# Patient Record
Sex: Male | Born: 1945
Health system: Southern US, Community
[De-identification: ages and names within clinical notes are randomized; demographics above are authoritative.]

## PROBLEM LIST (undated history)

## (undated) DIAGNOSIS — K635 Polyp of colon: Secondary | ICD-10-CM

## (undated) DIAGNOSIS — M1990 Unspecified osteoarthritis, unspecified site: Secondary | ICD-10-CM

## (undated) DIAGNOSIS — J351 Hypertrophy of tonsils: Secondary | ICD-10-CM

## (undated) DIAGNOSIS — G4730 Sleep apnea, unspecified: Secondary | ICD-10-CM

## (undated) DIAGNOSIS — R0602 Shortness of breath: Secondary | ICD-10-CM

## (undated) HISTORY — PX: EYE SURGERY: SHX253

## (undated) HISTORY — PX: FEMUR SOFT TISSUE TUMOR EXCISION: SUR552

---

## 1992-04-29 HISTORY — PX: REFRACTIVE SURGERY: SHX103

## 2008-04-29 HISTORY — PX: APPENDECTOMY: SHX54

## 2008-08-12 ENCOUNTER — Ambulatory Visit (HOSPITAL_COMMUNITY): Admission: EM | Admit: 2008-08-12 | Discharge: 2008-08-13 | Payer: Self-pay | Admitting: Emergency Medicine

## 2008-08-12 ENCOUNTER — Encounter (INDEPENDENT_AMBULATORY_CARE_PROVIDER_SITE_OTHER): Payer: Self-pay | Admitting: Surgery

## 2010-08-08 LAB — URINALYSIS, ROUTINE W REFLEX MICROSCOPIC
Hgb urine dipstick: NEGATIVE
Ketones, ur: 40 mg/dL — AB
Protein, ur: NEGATIVE mg/dL
Specific Gravity, Urine: 1.019 (ref 1.005–1.030)
Urobilinogen, UA: 0.2 mg/dL (ref 0.0–1.0)

## 2010-08-08 LAB — COMPREHENSIVE METABOLIC PANEL
AST: 22 U/L (ref 0–37)
CO2: 25 mEq/L (ref 19–32)
Calcium: 9.7 mg/dL (ref 8.4–10.5)
Creatinine, Ser: 1.03 mg/dL (ref 0.4–1.5)
GFR calc Af Amer: >60 mL/min (ref >60)
GFR calc non Af Amer: >60 mL/min (ref >60)
Glucose, Bld: 121 mg/dL — ABNORMAL HIGH (ref 70–99)
Total Protein: 7.6 g/dL (ref 6.0–8.3)

## 2010-08-08 LAB — CBC
MCHC: 34.1 g/dL (ref 30.0–36.0)
MCV: 92.6 fL (ref 78.0–100.0)
RBC: 5.05 MIL/uL (ref 4.22–5.81)
RDW: 13.7 % (ref 11.5–15.5)

## 2010-08-08 LAB — DIFFERENTIAL
Lymphocytes Relative: 9 % — ABNORMAL LOW (ref 12–46)
Lymphs Abs: 1 10*3/uL (ref 0.7–4.0)
Neutrophils Relative %: 87 % — ABNORMAL HIGH (ref 43–77)

## 2010-08-08 LAB — LIPASE, BLOOD: Lipase: 22 U/L (ref 11–59)

## 2010-09-11 NOTE — H&P (Signed)
NAME:  Kyle Beck, BEAUBRUN NO.:  [id number]   MEDICAL RECORD NO.:  [id number]          PATIENT TYPE:  INP   LOCATION:  1534                         FACILITY:  [facility]   PHYSICIAN:  Clovis Pu. Cornett, M.D.DATE OF BIRTH:  17-Jan-1946   DATE OF ADMISSION:  08/12/2008  DATE OF DISCHARGE:                              HISTORY & PHYSICAL   CHIEF COMPLAINT:  Right lower quadrant pain.   HISTORY OF PRESENT ILLNESS:  The patient is a 65 year old male with a 1-  day history of right lower quadrant pain.  The pain is located in his  right lower quadrant.  It is severe in nature, 10/10, with no radiation.  It is associated with increased pain with movement and activity and  better when he lies still.  There is no diarrhea, nausea, vomiting, or  constipation.   PAST MEDICAL HISTORY:  None.   PAST SURGICAL HISTORY:  None.   FAMILY HISTORY:  None.   SOCIAL HISTORY:  Positive tobacco use.  Occasional alcohol use.  He is  married.   ALLERGIES:  CODEINE.   MEDICATIONS:  None.   REVIEW OF SYSTEMS:  Positive for the above-mentioned problems, otherwise  negative x15.   PHYSICAL EXAMINATION:  VITAL SIGNS:  Temperature 97, pulse 82, and blood  pressure 163/78.  HEENT:  Extraocular movements are intact.  Oropharynx is clear.  NECK:  Supple and nontender.  Trachea midline.  PULMONARY:  Lungs are clear auscultation bilaterally.  Chest wall motion  normal.  CARDIOVASCULAR:  Regular rate and rhythm without rub, murmur, or gallop.  EXTREMITIES:  Warm and well perfused.  ABDOMEN:  Tender in right lower quadrant with rebound and guarding  noted.  No hernia.  No ascites.  EXTREMITIES:  No joint swelling.  Full range of motion of extremities.  Muscle tone normal.  NEURO:  Glasgow coma scale is 15.  Motor and sensory function are  intact.   DIAGNOSTIC STUDIES:  Abdominopelvic CT shows acute appendicitis without  perforation.  Sodium 132, potassium 4.2, chloride 97, CO2 25, BUN 11,  creatinine 1, glucose 121, white count 12,000 with left shift, and  hemoglobin 16.  Urinalysis unremarkable.   IMPRESSION:  Acute appendicitis.   PLAN:  Laparoscopic appendectomy in the operating room.  Risk of  bleeding, infection, and perforation of other organs were discussed with  potential complications.  Also the potential need for open conversion  was discussed with the patient.  He understands the above and agrees to  proceed.      [name], M.D.  Electronically Signed     TAC/MEDQ  D:  08/12/2008  T:  08/13/2008  Job:  [id number]

## 2010-09-11 NOTE — Discharge Summary (Signed)
NAME:  Kyle Beck, Kyle Beck NO.:  [id number]   MEDICAL RECORD NO.:  [id number]          PATIENT TYPE:  INP   LOCATION:  1534                         FACILITY:  [facility]   PHYSICIAN:  Clovis Pu. Cornett, M.D.DATE OF BIRTH:  April 03, 1946   DATE OF ADMISSION:  08/12/2008  DATE OF DISCHARGE:  08/13/2008                               DISCHARGE SUMMARY   ADMISSION DIAGNOSIS:  Acute appendicitis.   DISCHARGE DIAGNOSIS:  Acute appendicitis.   PROCEDURE PERFORMED:  Laparoscopic appendectomy.   HISTORY:  The patient is a 65 year old male with acute appendicitis.  He  was admitted on 08/12/2008, for a laparoscopic appendectomy.   HOSPITAL COURSE:  The patient was admitted on 08/12/2008.  He was  discharged home on 08/13/2008.  He had adequate pain control.  His  abdomen was soft and nontender.  His diet was advanced.  He had no  complaints.  His wounds were clean, dry and intact.   DISCHARGE INSTRUCTIONS:  1. He will follow up in three weeks.  2. He will refrain from heavy activity for 10 to 14 days.  3. He was given a prescription for Vicodin, one to two tab q.4h.      p.r.n. pain.  4. He is to resume a regular diet.  5. He may shower today.      [name], M.D.  Electronically Signed     TAC/MEDQ  D:  08/13/2008  T:  08/13/2008  Job:  [id number]

## 2010-09-11 NOTE — Op Note (Signed)
NAME:  MASUD, HOLUB NO.:  [id number]   MEDICAL RECORD NO.:  [id number]          PATIENT TYPE:  INP   LOCATION:  0098                         FACILITY:  [facility]   PHYSICIAN:  Clovis Pu. Cornett, M.D.DATE OF BIRTH:  08/30/1945   DATE OF PROCEDURE:  08/12/2008  DATE OF DISCHARGE:                               OPERATIVE REPORT   PREOPERATIVE DIAGNOSIS:  Acute appendicitis.   POSTOPERATIVE DIAGNOSIS:  Acute appendicitis.   PROCEDURE:  Laparoscopic appendectomy.   SURGEON:  Dr. Harriette Bouillon.   ANESTHESIA:  General endotracheal anesthesia with 0.25% Sensorcaine  local.   ESTIMATED BLOOD LOSS:  30 mL.   SPECIMEN:  Appendix to pathology.   DRAINS:  None.   INDICATIONS FOR PROCEDURE:  The patient is a 65 year old male with a 1-  day history of right lower quadrant pain.  CT scan showed acute  appendicitis and he is brought to the operating room for laparoscopic  appendectomy.   DESCRIPTION OF PROCEDURE:  The patient was brought to the operating room  and placed supine.  After induction of general anesthesia, both arms  were tucked and a Foley catheter was placed.  The abdomen was then  prepped and draped in a sterile fashion.  A 1-cm supraumbilical incision  was made.  Dissection was carried down to the fascia and the fascia was  opened with the scalpel.  The abdominal cavity was entered easily under  direct vision.  A pursestring suture of 0 Vicryl was placed a 12-mm  Hassan cannula was placed under direct vision.  Pneumoperitoneum was  created to 15 mmHg of CO2 and the laparoscope was placed.  Upon  inspection, the cecum was identified.  Two 5-mm ports were placed, one  in the midline between the umbilicus and the pubis and the other in the  left lower quadrant.  The appendix was easily identified, grabbed by its  tip and pulled up.  The harmonic scalpel was used to take the  mesoappendix down to the base of the appendix at the junction of the  cecum.  We  then used a 5-mm scope and a GIA 45 stapling device was  placed across the base of the appendix of the cecum and fired.  The  appendix was placed in an EndoCatch bag and passed off the field.  We  replaced the port and replaced the 10-mm camera.  We irrigated out the  abdominal cavity.  There was some oozing from the staple line and  Surgicel was placed, and this was a satisfactory result.  The  mesoappendix was hemostatic.  I did not see any significant intra-  abdominal at this point.  The small bowel and colon were grossly normal.  The liver and gallbladder appeared grossly normal.  At this point in  time we let the CO2 escape and removed the ports under direct vision.  The port site  was closed with a pursestring suture of 0 Vicryl and 4-0 Monocryl was  used to close the skin incisions.  All final counts of sponge, needle  and instruments were found to be correct at this portion  of the case.  The patient was awakened and taken to recovery in satisfactory  condition.      [name], M.D.  Electronically Signed     TAC/MEDQ  D:  08/12/2008  T:  08/13/2008  Job:  [id number]

## 2011-04-30 HISTORY — PX: COLONOSCOPY: SHX174

## 2011-05-03 DIAGNOSIS — J0100 Acute maxillary sinusitis, unspecified: Secondary | ICD-10-CM | POA: Diagnosis not present

## 2011-05-07 ENCOUNTER — Other Ambulatory Visit: Payer: Self-pay | Admitting: Dermatology

## 2011-05-07 DIAGNOSIS — L259 Unspecified contact dermatitis, unspecified cause: Secondary | ICD-10-CM | POA: Diagnosis not present

## 2011-05-07 DIAGNOSIS — D2370 Other benign neoplasm of skin of unspecified lower limb, including hip: Secondary | ICD-10-CM | POA: Diagnosis not present

## 2011-05-07 DIAGNOSIS — D485 Neoplasm of uncertain behavior of skin: Secondary | ICD-10-CM | POA: Diagnosis not present

## 2011-05-07 DIAGNOSIS — L219 Seborrheic dermatitis, unspecified: Secondary | ICD-10-CM | POA: Diagnosis not present

## 2011-05-13 DIAGNOSIS — E785 Hyperlipidemia, unspecified: Secondary | ICD-10-CM | POA: Diagnosis not present

## 2011-05-13 DIAGNOSIS — M109 Gout, unspecified: Secondary | ICD-10-CM | POA: Diagnosis not present

## 2011-05-13 DIAGNOSIS — J381 Polyp of vocal cord and larynx: Secondary | ICD-10-CM | POA: Diagnosis not present

## 2011-05-13 DIAGNOSIS — J3089 Other allergic rhinitis: Secondary | ICD-10-CM | POA: Diagnosis not present

## 2011-05-14 ENCOUNTER — Ambulatory Visit (HOSPITAL_BASED_OUTPATIENT_CLINIC_OR_DEPARTMENT_OTHER): Payer: Medicare Other | Attending: Otolaryngology | Admitting: Radiology

## 2011-05-14 VITALS — Ht 70.0 in | Wt 235.0 lb

## 2011-05-14 DIAGNOSIS — I4949 Other premature depolarization: Secondary | ICD-10-CM | POA: Diagnosis not present

## 2011-05-14 DIAGNOSIS — G4733 Obstructive sleep apnea (adult) (pediatric): Secondary | ICD-10-CM | POA: Diagnosis not present

## 2011-05-14 DIAGNOSIS — I491 Atrial premature depolarization: Secondary | ICD-10-CM | POA: Insufficient documentation

## 2011-05-14 DIAGNOSIS — G4730 Sleep apnea, unspecified: Secondary | ICD-10-CM

## 2011-05-18 DIAGNOSIS — I491 Atrial premature depolarization: Secondary | ICD-10-CM | POA: Diagnosis not present

## 2011-05-18 DIAGNOSIS — G4733 Obstructive sleep apnea (adult) (pediatric): Secondary | ICD-10-CM

## 2011-05-18 DIAGNOSIS — I4949 Other premature depolarization: Secondary | ICD-10-CM

## 2011-05-18 NOTE — Procedures (Signed)
NAME:  Kyle Beck, Kyle Beck NO.:  [id number]  MEDICAL RECORD NO.:  [id number]          PATIENT TYPE:  OUT  LOCATION:  SLEEP CENTER                 FACILITY:  [facility]  PHYSICIAN:  Clinton D. Maple Hudson, MD, FCCP, FACPDATE OF BIRTH:  12-15-1945  DATE OF STUDY:  05/14/2011                           NOCTURNAL POLYSOMNOGRAM  REFERRING PHYSICIAN:  Hermelinda Medicus, M.D.  INDICATION FOR STUDY:  Hypersomnia with sleep apnea.  EPWORTH SLEEPINESS SCORE:  3/24.  BMI 33.7, weight 235 pounds, height 70 inches, neck 17 inches.  MEDICATIONS:  Charted and reviewed.  SLEEP ARCHITECTURE:  Split study protocol.  During the diagnostic phase, total sleep time 122.5 minutes with sleep efficiency 71%.  Stage I was 12.2%, stage II 77.6%, stage III absent, REM 10.2% of total sleep time. Sleep latency 15.5 minutes, REM latency 39 minutes.  Awake after sleep onset 25 minutes.  Arousal index 35.8.  Bedtime medication: None.  RESPIRATORY DATA:  Split study protocol.  Apnea hypopnea index (AHI) 24 per hour.  A total of 49 events was scored including 25 obstructive apneas and 24 hypopneas.  Events were seen in all sleep positions.  REM AHI 48 per hour.  CPAP was then titrated to 10 CWP, AHI 0.9 per hour. He wore a medium ResMed Mirage Quattro full face mask with heated humidifier and a C flex setting of 2.  OXYGEN DATA:  Before CPAP snoring was moderately loud with oxygen desaturation to a nadir of 79% on room air.  After CPAP titration, snoring was prevented and mean oxygen saturation held 92.7% on room air.  CARDIAC DATA:  Sinus rhythm with occasional PVC and PAC.  MOVEMENT-PARASOMNIA:  No significant movement disturbance.  Bathroom x1.  IMPRESSIONS-RECOMMENDATIONS: 1. Moderate obstructive sleep apnea/hypopnea syndrome, AHI 24 per hour     with moderately loud snoring and oxygen desaturation to a nadir of     79% on room air. 2. Successful CPAP titration to 10 CWP, AHI 0.9 per hour.  He wore a     medium ResMed Mirage Quattro full face mask with heated humidifier     and a C flex setting of 2.     Clinton D. Maple Hudson, MD, [facility], FACP Diplomate, Biomedical engineer of Sleep Medicine Electronically Signed    CDY/MEDQ  D:  05/18/2011 10:37:44  T:  05/18/2011 10:57:41  Job:  [id number]

## 2011-05-28 DIAGNOSIS — J320 Chronic maxillary sinusitis: Secondary | ICD-10-CM | POA: Diagnosis not present

## 2011-05-29 DIAGNOSIS — K648 Other hemorrhoids: Secondary | ICD-10-CM | POA: Diagnosis not present

## 2011-05-29 DIAGNOSIS — L290 Pruritus ani: Secondary | ICD-10-CM | POA: Diagnosis not present

## 2011-05-29 DIAGNOSIS — R21 Rash and other nonspecific skin eruption: Secondary | ICD-10-CM | POA: Diagnosis not present

## 2011-05-30 ENCOUNTER — Encounter (HOSPITAL_COMMUNITY): Payer: Self-pay | Admitting: Pharmacy Technician

## 2011-06-04 ENCOUNTER — Other Ambulatory Visit: Payer: Self-pay | Admitting: Dermatology

## 2011-06-04 DIAGNOSIS — D1801 Hemangioma of skin and subcutaneous tissue: Secondary | ICD-10-CM | POA: Diagnosis not present

## 2011-06-04 DIAGNOSIS — D485 Neoplasm of uncertain behavior of skin: Secondary | ICD-10-CM | POA: Diagnosis not present

## 2011-06-05 ENCOUNTER — Other Ambulatory Visit (HOSPITAL_COMMUNITY): Payer: Self-pay | Admitting: *Deleted

## 2011-06-06 ENCOUNTER — Encounter (HOSPITAL_COMMUNITY)
Admission: RE | Admit: 2011-06-06 | Discharge: 2011-06-06 | Disposition: A | Discharge status: Home / Self Care | Payer: Medicare Other | Source: Ambulatory Visit | Attending: Otolaryngology | Admitting: Otolaryngology

## 2011-06-06 ENCOUNTER — Other Ambulatory Visit: Payer: Self-pay

## 2011-06-06 ENCOUNTER — Encounter (HOSPITAL_COMMUNITY): Payer: Self-pay

## 2011-06-06 DIAGNOSIS — Z01810 Encounter for preprocedural cardiovascular examination: Secondary | ICD-10-CM | POA: Diagnosis not present

## 2011-06-06 DIAGNOSIS — D104 Benign neoplasm of tonsil: Secondary | ICD-10-CM | POA: Diagnosis not present

## 2011-06-06 DIAGNOSIS — J342 Deviated nasal septum: Secondary | ICD-10-CM | POA: Diagnosis not present

## 2011-06-06 DIAGNOSIS — G4733 Obstructive sleep apnea (adult) (pediatric): Secondary | ICD-10-CM | POA: Diagnosis not present

## 2011-06-06 DIAGNOSIS — Z01818 Encounter for other preprocedural examination: Secondary | ICD-10-CM | POA: Diagnosis not present

## 2011-06-06 DIAGNOSIS — Z01812 Encounter for preprocedural laboratory examination: Secondary | ICD-10-CM | POA: Diagnosis not present

## 2011-06-06 DIAGNOSIS — M47814 Spondylosis without myelopathy or radiculopathy, thoracic region: Secondary | ICD-10-CM | POA: Diagnosis not present

## 2011-06-06 DIAGNOSIS — J343 Hypertrophy of nasal turbinates: Secondary | ICD-10-CM | POA: Diagnosis not present

## 2011-06-06 DIAGNOSIS — Z01811 Encounter for preprocedural respiratory examination: Secondary | ICD-10-CM | POA: Diagnosis not present

## 2011-06-06 HISTORY — DX: Sleep apnea, unspecified: G47.30

## 2011-06-06 HISTORY — DX: Polyp of colon: K63.5

## 2011-06-06 HISTORY — DX: Unspecified osteoarthritis, unspecified site: M19.90

## 2011-06-06 HISTORY — DX: Shortness of breath: R06.02

## 2011-06-06 HISTORY — DX: Hypertrophy of tonsils: J35.1

## 2011-06-06 LAB — CBC
HCT: 47.6 % (ref 39.0–52.0)
Hemoglobin: 15.7 g/dL (ref 13.0–17.0)
MCH: 30.6 pg (ref 26.0–34.0)
MCHC: 33 g/dL (ref 30.0–36.0)
MCV: 92.8 fL (ref 78.0–100.0)
Platelets: 205 K/uL (ref 150–400)
RBC: 5.13 MIL/uL (ref 4.22–5.81)
RDW: 12.9 % (ref 11.5–15.5)
WBC: 7.5 K/uL (ref 4.0–10.5)

## 2011-06-06 LAB — DIFFERENTIAL
Basophils Absolute: 0 K/uL (ref 0.0–0.1)
Basophils Relative: 0 % (ref 0–1)
Eosinophils Absolute: 0.4 10*3/uL (ref 0.0–0.7)
Eosinophils Relative: 5 % (ref 0–5)
Lymphocytes Relative: 33 % (ref 12–46)
Lymphs Abs: 2.5 K/uL (ref 0.7–4.0)
Monocytes Absolute: 0.7 10*3/uL (ref 0.1–1.0)
Monocytes Relative: 9 % (ref 3–12)
Neutro Abs: 4 K/uL (ref 1.7–7.7)
Neutrophils Relative %: 53 % (ref 43–77)

## 2011-06-06 LAB — SURGICAL PCR SCREEN
MRSA, PCR: NEGATIVE
Staphylococcus aureus: NEGATIVE

## 2011-06-06 NOTE — Pre-Procedure Instructions (Signed)
20 Kyle Beck  06/06/2011   Your procedure is scheduled on:  Monday, Feb 11  Report to [facility] at *0740** AM.  Call this number if you have problems the morning of surgery: [phone]   Remember:   Do not eat food:After Midnight.  May have clear liquids: up to 4 Hours before arrival.  Clear liquids include soda, tea, black coffee, apple or grape juice, broth.  Take these medicines the morning of surgery with A SIP OF WATER: *none**   Do not wear jewelry, make-up or nail polish.  Do not wear lotions, powders, or perfumes. You may wear deodorant.  Do not shave 48 hours prior to surgery.  Do not bring valuables to the hospital.  Contacts, dentures or bridgework may not be worn into surgery.  Leave suitcase in the car. After surgery it may be brought to your room.  For patients admitted to the hospital, checkout time is 11:00 AM the day of discharge.   Patients discharged the day of surgery will not be allowed to drive home.  Name and phone number of your driver: *[name]**  Special Instructions: CHG Shower Use Special Wash: 1/2 bottle night before surgery and 1/2 bottle morning of surgery.   Please read over the following fact sheets that you were given: Pain Booklet, Coughing and Deep Breathing, MRSA Information and Surgical Site Infection Prevention

## 2011-06-07 NOTE — H&P (Signed)
NAME:  Kyle Beck, Kyle Beck NO.:  [id number]  MEDICAL RECORD NO.:  [id number]  LOCATION:  SDS                          FACILITY:  [facility]  PHYSICIAN:  Hermelinda Medicus, M.D.   DATE OF BIRTH:  1945/08/16  DATE OF ADMISSION:  06/06/2011 DATE OF DISCHARGE:  06/06/2011                             HISTORY & PHYSICAL   HISTORY OF PRESENT ILLNESS:  This patient is a 66 year old male who I have seen in the distant past.  He has had sinus issues in the past. 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  He has a past history of allergy to CODEINE, makes him very sleepy.  He has no other allergies to medications.  His further past history is one of appendectomy and he has had a general anesthesia in the past.  He has other symptoms of arthritis.  FAMILY HISTORY:  His mom has had cancer and diabetes, glaucoma, high blood pressure, arthritis, bladder disease.  SOCIAL HISTORY:  He did smoke about 1 pack a day and does drink occasionally.  Exercises about twice a week.  REVIEW OF SYSTEMS:  His review of systems is one of nasal blockage and postnasal drainage.  He does wear glasses.  His bowel, bladder, kidney, respiratory situation is totally unremarkable, and he has a slight wheezing when he sleeps.  Psychiatric, endocrine, hematologic, allergic are all within normal limits.  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  He has now a polyp in his right tonsillar region what appears to be a papilloma, but he also has sleep apnea and snoring.  He has a history of smoking and history of septal deviation.  He has a considerable sports history where he injured his nose on several occasions.  His sleep study shows a BMI of 33.7, he weighs 235.  His REM and stage III sleep time is absent.  REM represented only 10.2% of total sleep time.  His arousal index was 35.8.  His respiratory data shows an apnea- hypopnea index or an AHI of 24, total of  49 events included 25 obstructive apneas and 24 hypopneas.  His CPAP has worked reasonably well for him, but he just could not use it.  Oxygen data revealed the desaturation to 79%, and he had occasional PVC and PAC.  DIAGNOSIS:  Moderate obstructive sleep apnea-hypopnea syndrome, but he has very large snoring with oxygen desaturation of 79%.  Successful CPAP titration 10 CWP.  However, he cannot do CPAP, he cannot tolerate it, and wishes to have a septal reconstruction, turbinate reduction, uvulopalatoplasty, and we will also remove that tonsillar polyp and I would say that he would not need any further CPAP intervention after that time.  The polyp was approximately 1 cm in size.  We would do this under general endotracheal anesthesia.  His only allergy as I above- stated is to CODEINE.  PHYSICAL EXAMINATION:  Blood pressure 140/90.  His ears are completely clear.  Tympanic membranes look excellent and they move well.  His nose shows severe septal deviation, and he has just obstruction of his nasal vestibules.  His nasopharynx is completely clear.  Larynx is clear.  True cords, false cords, epiglottis, base of tongue, lateral pharyngeal walls are completely clear and true cord mobility, gag reflex, tongue mobility, EOMs, facial nerve are all symmetrical.  His larynx is free of any ulceration, mass, or lesion.  His neck is free of any thyromegaly, cervical adenopathy, or mass.  No salivary gland abnormalities, however, looking at his oral cavity he has 1 cm diameter polyp on his right tonsillar region.  His palate is very low.  His uvula is large causing considerable amount of this sleep apnea issues.  His chest is clear.  No rales, rhonchi, or wheezes.  His chest x-ray shows no active disease, but mild degenerative changes in his thoracic spine.  His polysomnogram will be in the chart and has been above stated as to their values. Cardiovascular examination reveals no murmurs or gallops.   Abdomen unremarkable, except he is mildly obese.  Extremities unremarkable.  He does not take any medications at this time.  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          ______________________________ Hermelinda Medicus, M.D.     JC/MEDQ  D:  06/06/2011  T:  06/07/2011  Job:  [id number]  cc:   Fleet Contras, M.D.

## 2011-06-10 ENCOUNTER — Encounter (HOSPITAL_COMMUNITY)
Admission: RE | Disposition: A | Discharge status: Home / Self Care | Payer: Self-pay | Source: Ambulatory Visit | Attending: Otolaryngology

## 2011-06-10 ENCOUNTER — Other Ambulatory Visit: Payer: Self-pay | Admitting: Otolaryngology

## 2011-06-10 ENCOUNTER — Ambulatory Visit (HOSPITAL_COMMUNITY): Payer: Medicare Other | Admitting: Anesthesiology

## 2011-06-10 ENCOUNTER — Observation Stay (HOSPITAL_COMMUNITY)
Admission: RE | Admit: 2011-06-10 | Discharge: 2011-06-11 | Disposition: A | Discharge status: Home / Self Care | Payer: Medicare Other | Source: Ambulatory Visit | Attending: Otolaryngology | Admitting: Otolaryngology

## 2011-06-10 ENCOUNTER — Encounter (HOSPITAL_COMMUNITY): Payer: Self-pay | Admitting: *Deleted

## 2011-06-10 ENCOUNTER — Encounter (HOSPITAL_COMMUNITY): Payer: Self-pay | Admitting: Anesthesiology

## 2011-06-10 DIAGNOSIS — D104 Benign neoplasm of tonsil: Secondary | ICD-10-CM | POA: Insufficient documentation

## 2011-06-10 DIAGNOSIS — Z01810 Encounter for preprocedural cardiovascular examination: Secondary | ICD-10-CM | POA: Insufficient documentation

## 2011-06-10 DIAGNOSIS — G4730 Sleep apnea, unspecified: Secondary | ICD-10-CM | POA: Diagnosis not present

## 2011-06-10 DIAGNOSIS — G4710 Hypersomnia, unspecified: Secondary | ICD-10-CM | POA: Diagnosis not present

## 2011-06-10 DIAGNOSIS — J342 Deviated nasal septum: Secondary | ICD-10-CM | POA: Diagnosis not present

## 2011-06-10 DIAGNOSIS — J039 Acute tonsillitis, unspecified: Secondary | ICD-10-CM | POA: Diagnosis not present

## 2011-06-10 DIAGNOSIS — J358 Other chronic diseases of tonsils and adenoids: Secondary | ICD-10-CM | POA: Diagnosis not present

## 2011-06-10 DIAGNOSIS — G4733 Obstructive sleep apnea (adult) (pediatric): Principal | ICD-10-CM | POA: Insufficient documentation

## 2011-06-10 DIAGNOSIS — Z01812 Encounter for preprocedural laboratory examination: Secondary | ICD-10-CM | POA: Insufficient documentation

## 2011-06-10 DIAGNOSIS — J343 Hypertrophy of nasal turbinates: Secondary | ICD-10-CM | POA: Diagnosis not present

## 2011-06-10 DIAGNOSIS — Z01818 Encounter for other preprocedural examination: Secondary | ICD-10-CM | POA: Insufficient documentation

## 2011-06-10 HISTORY — PX: UVULOPALATOPHARYNGOPLASTY: SHX827

## 2011-06-10 HISTORY — PX: POLYPECTOMY: SHX5525

## 2011-06-10 SURGERY — RECONSTRUCTION, NOSE, WITH NASAL SEPTUM REPAIR
Anesthesia: General | Site: Nose | Laterality: Right | Wound class: Clean Contaminated

## 2011-06-10 MED ORDER — CEFAZOLIN SODIUM 1-5 GM-% IV SOLN
1.0000 g | Freq: Three times a day (TID) | INTRAVENOUS | Status: DC
  Administered 2011-06-10 – 2011-06-11 (×3): 1 g via INTRAVENOUS
  Filled 2011-06-10 (×5): qty 50

## 2011-06-10 MED ORDER — CEPHALEXIN 250 MG/5ML PO SUSR: 500.0000 mg | Freq: Two times a day (BID) | ORAL | Status: DC

## 2011-06-10 MED ORDER — LACTATED RINGERS IV SOLN
INTRAVENOUS | Status: DC
  Administered 2011-06-10: 09:00:00 via INTRAVENOUS

## 2011-06-10 MED ORDER — CEFAZOLIN SODIUM 1-5 GM-% IV SOLN
INTRAVENOUS | Status: AC
  Filled 2011-06-10: qty 100

## 2011-06-10 MED ORDER — LIDOCAINE-EPINEPHRINE 1 %-1:100000 IJ SOLN
INTRAMUSCULAR | Status: DC | PRN
  Administered 2011-06-10: 3 mL

## 2011-06-10 MED ORDER — BACITRACIN-NEOMYCIN-POLYMYXIN 400-5-5000 EX OINT
TOPICAL_OINTMENT | CUTANEOUS | Status: DC | PRN
  Administered 2011-06-10: 1 via TOPICAL

## 2011-06-10 MED ORDER — BIOTENE DRY MOUTH MT LIQD
15.0000 mL | Freq: Two times a day (BID) | OROMUCOSAL | Status: DC
  Administered 2011-06-10: 15 mL via OROMUCOSAL

## 2011-06-10 MED ORDER — MEPERIDINE HCL 25 MG/ML IJ SOLN: 6.2500 mg | INTRAMUSCULAR | Status: DC | PRN

## 2011-06-10 MED ORDER — COCAINE HCL POWD
Status: AC
  Filled 2011-06-10: qty 200

## 2011-06-10 MED ORDER — COCAINE HCL POWD
Status: DC | PRN
  Administered 2011-06-10: 200 mg via NASAL

## 2011-06-10 MED ORDER — LACTATED RINGERS IV SOLN
INTRAVENOUS | Status: DC | PRN
  Administered 2011-06-10 (×2): via INTRAVENOUS

## 2011-06-10 MED ORDER — 0.9 % SODIUM CHLORIDE (POUR BTL) OPTIME
TOPICAL | Status: DC | PRN
  Administered 2011-06-10: 1000 mL

## 2011-06-10 MED ORDER — EPHEDRINE SULFATE 50 MG/ML IJ SOLN
INTRAMUSCULAR | Status: DC | PRN
  Administered 2011-06-10: 10 mg via INTRAVENOUS
  Administered 2011-06-10: 15 mg via INTRAVENOUS

## 2011-06-10 MED ORDER — DEXAMETHASONE SODIUM PHOSPHATE 10 MG/ML IJ SOLN
INTRAMUSCULAR | Status: DC | PRN
  Administered 2011-06-10: 10 mg via INTRAVENOUS

## 2011-06-10 MED ORDER — MIDAZOLAM HCL 5 MG/5ML IJ SOLN
INTRAMUSCULAR | Status: DC | PRN
  Administered 2011-06-10: 2 mg via INTRAVENOUS

## 2011-06-10 MED ORDER — ROCURONIUM BROMIDE 100 MG/10ML IV SOLN
INTRAVENOUS | Status: DC | PRN
  Administered 2011-06-10: 50 mg via INTRAVENOUS

## 2011-06-10 MED ORDER — PROMETHAZINE HCL 25 MG/ML IJ SOLN: 6.2500 mg | INTRAMUSCULAR | Status: DC | PRN

## 2011-06-10 MED ORDER — GLYCOPYRROLATE 0.2 MG/ML IJ SOLN
INTRAMUSCULAR | Status: DC | PRN
  Administered 2011-06-10: .8 mg via INTRAVENOUS

## 2011-06-10 MED ORDER — ALBUTEROL SULFATE (2.5 MG/3ML) 0.083% IN NEBU
INHALATION_SOLUTION | RESPIRATORY_TRACT | Status: DC | PRN
  Administered 2011-06-10: .45 mg via RESPIRATORY_TRACT

## 2011-06-10 MED ORDER — HYDROMORPHONE HCL PF 1 MG/ML IJ SOLN
0.2500 mg | INTRAMUSCULAR | Status: DC | PRN
  Administered 2011-06-10: 0.5 mg via INTRAVENOUS

## 2011-06-10 MED ORDER — DEXAMETHASONE SODIUM PHOSPHATE 10 MG/ML IJ SOLN
10.0000 mg | Freq: Once | INTRAMUSCULAR | Status: DC
  Filled 2011-06-10: qty 1

## 2011-06-10 MED ORDER — HYDROCODONE-ACETAMINOPHEN 7.5-500 MG/15ML PO SOLN: 10.0000 mL | ORAL | Status: DC | PRN

## 2011-06-10 MED ORDER — ONDANSETRON HCL 4 MG/2ML IJ SOLN
INTRAMUSCULAR | Status: DC | PRN
  Administered 2011-06-10: 4 mg via INTRAVENOUS

## 2011-06-10 MED ORDER — NEOSTIGMINE METHYLSULFATE 1 MG/ML IJ SOLN
INTRAMUSCULAR | Status: DC | PRN
  Administered 2011-06-10: 5 mg via INTRAVENOUS

## 2011-06-10 MED ORDER — SUFENTANIL CITRATE 50 MCG/ML IV SOLN
INTRAVENOUS | Status: DC | PRN
  Administered 2011-06-10: 5 ug via INTRAVENOUS
  Administered 2011-06-10: 25 ug via INTRAVENOUS

## 2011-06-10 MED ORDER — PROPOFOL 10 MG/ML IV EMUL
INTRAVENOUS | Status: DC | PRN
  Administered 2011-06-10: 150 mg via INTRAVENOUS

## 2011-06-10 MED ORDER — HYDROCODONE-ACETAMINOPHEN 7.5-500 MG/15ML PO SOLN
10.0000 mL | ORAL | Status: DC | PRN
  Administered 2011-06-10 – 2011-06-11 (×3): 15 mL via ORAL
  Filled 2011-06-10 (×3): qty 15

## 2011-06-10 MED ORDER — CEFAZOLIN SODIUM 1-5 GM-% IV SOLN
INTRAVENOUS | Status: DC | PRN
  Administered 2011-06-10: 2 g via INTRAVENOUS

## 2011-06-10 MED ORDER — HYDROMORPHONE HCL PF 1 MG/ML IJ SOLN: 0.2500 mg | INTRAMUSCULAR | Status: DC | PRN

## 2011-06-10 MED ORDER — LACTATED RINGERS IV SOLN
INTRAVENOUS | Status: DC
  Administered 2011-06-10: 14:00:00 via INTRAVENOUS

## 2011-06-10 SURGICAL SUPPLY — 33 items
ATTRACTOMAT 16X20 MAGNETIC DRP (DRAPES) IMPLANT
CLOTH BEACON ORANGE TIMEOUT ST (SAFETY) ×5 IMPLANT
COAGULATOR SUCT SWTCH 10FR 6 (ELECTROSURGICAL) IMPLANT
DRESSING NASAL POPE 10X1.5X2.5 (GAUZE/BANDAGES/DRESSINGS) IMPLANT
DRESSING TELFA 8X3 (GAUZE/BANDAGES/DRESSINGS) ×5 IMPLANT
DRSG NASAL POPE 10X1.5X2.5 (GAUZE/BANDAGES/DRESSINGS)
ELECT COATED BLADE 2.86 ST (ELECTRODE) ×5 IMPLANT
ELECT REM PT RETURN 9FT ADLT (ELECTROSURGICAL)
ELECTRODE REM PT RTRN 9FT ADLT (ELECTROSURGICAL) IMPLANT
GLOVE BIO SURGEON STRL SZ7.5 (GLOVE) ×5 IMPLANT
GLOVE BIOGEL PI IND STRL 7.0 (GLOVE) ×4 IMPLANT
GLOVE BIOGEL PI IND STRL 7.5 (GLOVE) ×4 IMPLANT
GLOVE BIOGEL PI INDICATOR 7.0 (GLOVE) ×1
GLOVE BIOGEL PI INDICATOR 7.5 (GLOVE) ×1
GLOVE SURG SS PI 7.5 STRL IVOR (GLOVE) ×5 IMPLANT
GOWN PREVENTION PLUS XLARGE (GOWN DISPOSABLE) ×5 IMPLANT
GOWN STRL NON-REIN LRG LVL3 (GOWN DISPOSABLE) ×5 IMPLANT
KIT BASIN OR (CUSTOM PROCEDURE TRAY) ×5 IMPLANT
KIT ROOM TURNOVER OR (KITS) ×5 IMPLANT
NEEDLE SPNL 25GX3.5 QUINCKE BL (NEEDLE) ×5 IMPLANT
NS IRRIG 1000ML POUR BTL (IV SOLUTION) ×5 IMPLANT
PAD ARMBOARD 7.5X6 YLW CONV (MISCELLANEOUS) ×10 IMPLANT
PENCIL BUTTON HOLSTER BLD 10FT (ELECTRODE) ×5 IMPLANT
SPECIMEN JAR SMALL (MISCELLANEOUS) ×20 IMPLANT
SUT PLAIN 4 0 ~~LOC~~ 1 (SUTURE) ×5 IMPLANT
SUT PLAIN 5 0 P 3 18 (SUTURE) ×5 IMPLANT
SYR 3ML LL SCALE MARK (SYRINGE) ×5 IMPLANT
TOWEL OR 17X24 6PK STRL BLUE (TOWEL DISPOSABLE) ×5 IMPLANT
TOWEL OR 17X26 10 PK STRL BLUE (TOWEL DISPOSABLE) ×5 IMPLANT
TOWEL OR NON WOVEN STRL DISP B (DISPOSABLE) ×5 IMPLANT
TRAY ENT MC OR (CUSTOM PROCEDURE TRAY) ×5 IMPLANT
WATER STERILE IRR 1000ML POUR (IV SOLUTION) ×5 IMPLANT
YANKAUER SUCT BULB TIP NO VENT (SUCTIONS) ×5 IMPLANT

## 2011-06-10 NOTE — Progress Notes (Signed)
Dr. Haroldine Laws here and removed nasal airway.  Also removed drip pad and states pt may just use Kleenex.  Instructed not to blow nose.  Cold compress applied to nasal bridge.

## 2011-06-10 NOTE — Anesthesia Procedure Notes (Signed)
Procedure Name: Intubation Date/Time: 06/10/2011 9:51 AM Performed by: Glendora Score Pre-anesthesia Checklist: Patient identified, Emergency Drugs available, Suction available and Patient being monitored Patient Re-evaluated:Patient Re-evaluated prior to inductionOxygen Delivery Method: Circle System Utilized Preoxygenation: Pre-oxygenation with 100% oxygen Intubation Type: IV induction Ventilation: Mask ventilation without difficulty and Oral airway inserted - appropriate to patient size Laryngoscope Size: Hyacinth Meeker and 2 Grade View: Grade I Tube type: Oral Tube size: 7.5 mm Number of attempts: 1 Airway Equipment and Method: stylet and LTA Placement Confirmation: ETT inserted through vocal cords under direct vision,  positive ETCO2 and breath sounds checked- equal and bilateral Secured at: 23 cm Tube secured with: Tape Dental Injury: Teeth and Oropharynx as per pre-operative assessment  Comments: Tonsillar polyp noted. Remained untouched during DL. No bleeding noted

## 2011-06-10 NOTE — H&P (Signed)
No change since H&P

## 2011-06-10 NOTE — Transfer of Care (Signed)
Immediate Anesthesia Transfer of Care Note  Patient: Kyle Beck  Procedure(s) Performed:  NASAL RECONSTRUCTION WITH SEPTAL REPAIR; TURBINATE REDUCTION; UVULOPALATOPHARYNGOPLASTY (UPPP); POLYPECTOMY - right tonsilar polypectomy  Patient Location: PACU  Anesthesia Type: General  Level of Consciousness: awake, alert  and patient cooperative  Airway & Oxygen Therapy: Patient Spontanous Breathing and Patient connected to face mask oxygen  Post-op Assessment: Report given to PACU RN  Post vital signs: Reviewed and stable  Complications: No apparent anesthesia complications

## 2011-06-10 NOTE — Progress Notes (Signed)
Anesthia trumpet removed  Patient doing well

## 2011-06-10 NOTE — Anesthesia Preprocedure Evaluation (Signed)
Anesthesia Evaluation  Patient identified by MRN, date of birth, ID band Patient awake    Reviewed: Allergy & Precautions, H&P , NPO status , Patient's Chart, lab work & pertinent test results  History of Anesthesia Complications Negative for: history of anesthetic complications  Airway Mallampati: II  Neck ROM: Full    Dental  (+) Teeth Intact   Pulmonary shortness of breath, sleep apnea , Current Smoker,  clear to auscultation        Cardiovascular neg cardio ROS Regular Normal    Neuro/Psych Negative Neurological ROS     GI/Hepatic negative GI ROS, Neg liver ROS,   Endo/Other    Renal/GU negative Renal ROS     Musculoskeletal   Abdominal   Peds  Hematology   Anesthesia Other Findings   Reproductive/Obstetrics                           Anesthesia Physical Anesthesia Plan  ASA: II  Anesthesia Plan: General   Post-op Pain Management:    Induction: Intravenous  Airway Management Planned: Oral ETT  Additional Equipment:   Intra-op Plan:   Post-operative Plan: Extubation in OR  Informed Consent: I have reviewed the patients History and Physical, chart, labs and discussed the procedure including the risks, benefits and alternatives for the proposed anesthesia with the patient or authorized representative who has indicated his/her understanding and acceptance.   Dental advisory given  Plan Discussed with: CRNA and Surgeon  Anesthesia Plan Comments:         Anesthesia Quick Evaluation

## 2011-06-10 NOTE — Preoperative (Signed)
Beta Blockers   Reason not to administer Beta Blockers:Not Applicable 

## 2011-06-10 NOTE — Op Note (Signed)
Dictated op note [phone]

## 2011-06-10 NOTE — Anesthesia Postprocedure Evaluation (Signed)
  Anesthesia Post-op Note  Patient: Kyle Beck  Procedure(s) Performed:  NASAL RECONSTRUCTION WITH SEPTAL REPAIR; TURBINATE REDUCTION; UVULOPALATOPHARYNGOPLASTY (UPPP); POLYPECTOMY - right tonsilar polypectomy  Patient Location: PACU  Anesthesia Type: General  Level of Consciousness: awake  Airway and Oxygen Therapy: Patient Spontanous Breathing  Post-op Pain: mild  Post-op Assessment: Post-op Vital signs reviewed  Post-op Vital Signs: stable  Complications: No apparent anesthesia complications

## 2011-06-11 ENCOUNTER — Encounter (HOSPITAL_COMMUNITY): Payer: Self-pay | Admitting: Otolaryngology

## 2011-06-11 NOTE — Op Note (Signed)
NAME:  ADEYEMI, HAMAD NO.:  [id number]  MEDICAL RECORD NO.:  [id number]  LOCATION:  2605                         FACILITY:  [facility]  PHYSICIAN:  Hermelinda Medicus, M.D.   DATE OF BIRTH:  07-05-1945  DATE OF PROCEDURE: DATE OF DISCHARGE:                              OPERATIVE REPORT   This patient is a 66 year old male who comes in with a history of sleep apnea issues.  He has an AHI of 24.  REM sleep position it is 48, and CPAP was not truly effective.  He has never used CPAP.  His O2 nadir was 79% starting from a mean oxygen saturation of 92.7.  He also has a severe septal deviation from previous sports activity, very severe septal deviation blocking 100% of his nasal airway in the left side and swinging back in somewhat of a Z orientation blocking some of the right nasal airway.  He also has a concha bullosa on the right side.  His palate is very low.  Uvula is long, typical of a snorer, and he also has a 1 cm right tonsillar region palatal papilloma which we will remove, so therefore, our plan is to do a septal reconstruction, turbinate reduction, and an uvulopalatoplasty, and removal of papilloma under general endotracheal anesthesia.  The patient is aware of the risks and gains and his diet is going to be soft for approximately 10 days.  He could have some bleeding if he gets to eat aggressively.  He is going to have some palatal pain and we will keep him overnight 23-hour observation to monitor his O2 saturation in his postop course.  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  OPERATION:  Septal reconstruction, turbinate reduction with uvulopalatoplasty with removal or excision of a tonsillar  papilloma, right.  OPERATOR:  Hermelinda Medicus, MD  ANESTHESIA:  General endotracheal by Dr. Jacklynn Bue.  PROCEDURE:  In supine position under general endotracheal anesthesia. The nose was first approached after prepping and draping with Hibiclens and usual head drape.  The septum was grossly deviated to the left back in the posterior quadrilateral cartilage and the ethmoid and vomerine cartilage and after anesthesia 1% Xylocaine with epinephrine 3 mL and topical cocaine 200 mg was used.  The hemitransfixion incision was made in the left side and carried back along the quadrilateral cartilage and then the posterior strip of quadrilateral cartilage was taken using the Therapist, nutritional.  The open and close [employer] were also used to remove a vomerine and septal ethmoid deviation and this was trimmed back to a point where the septum was brought back to the midline.  The vomerine septal deviation was also approached in a similar way.  Once this was completed, the septum was established in the midline and closure was with 4-0 through and through plain catgut suture using it not only as a closure, but as a blanket stitch to minimize swelling. His inferior turbinates were outfractured and also the Elmed was used set at 12  to cauterize anteriorly with a bipolar cautery.  The right concha bullosa was also compressed using the polyp forceps, but no tissue was removed.  A Telfa pack was placed on each side and attention was then carried to the tonsillar region using the Crowe-Davis mouth gag.  The right tonsillar papilloma, which measured over 1 cm in diameter was removed without difficulty using the Bovie electrocoagulation and this will be sent for microscopic report.  The palate was then trimmed of elevating the contour, the palate approximately 8-9 mm.  The uvula was very long.  This was also partially excised bringing this up to its normal position and size.  Closure anterior-posterior  of the uvula, mucous membrane was with 5-0 plain catgut.  Hemostasis was established with Bovie electrocoagulation. Telfa was then removed and #8 on the right and a 7.5 on the left anesthesia trumpet was placed to guarantee his nasal airway, and he will be sent to the step-down unit 3300 as a 23-hour observation.          ______________________________ Hermelinda Medicus, M.D.     JC/MEDQ  D:  06/10/2011  T:  06/11/2011  Job:  [id number]  cc:   Fleet Contras, M.D.

## 2011-06-11 NOTE — Discharge Summary (Signed)
  [id number] dictated number

## 2011-06-12 DIAGNOSIS — K648 Other hemorrhoids: Secondary | ICD-10-CM | POA: Diagnosis not present

## 2011-06-12 NOTE — Discharge Summary (Signed)
NAME:  Kyle Beck, MACIOLEK NO.:  [id number]  MEDICAL RECORD NO.:  [id number]  LOCATION:  2605                         FACILITY:  [facility]  PHYSICIAN:  Hermelinda Medicus, M.D.   DATE OF BIRTH:  1945/12/04  DATE OF ADMISSION:  06/10/2011 DATE OF DISCHARGE:  06/11/2011                              DISCHARGE SUMMARY   He is a 66 year old male who had sleep apnea issues with a O2 nadir of 79.  His average O2 while awake was 92%.  His AHI was 45.  He came to the hospital in excellent health.  Chest x-ray was clear.  His hematocrit and hemoglobin were totally within normal limits, hematocrit of 47, his white count was 5.7.  MEDICATIONS:  His only medications were vitamins.  PAST SURGICAL HISTORY:  He underwent a septal reconstruction, turbinate reduction, and a uvulopalatoplasty.  We also removed a small 1 cm tonsillar papilloma on the right side.  PAST MEDICAL HISTORY:  His past history is unremarkable.  ALLERGIES:  He has no allergies to medications.  PHYSICAL EXAMINATION:  VITAL SIGNS:  Reveal a blood pressure of 140/80, his pulse is 72. HEENT:  The ears are completely clear.  Tympanic membranes are completely clear and the mobility is normal.  The septum shows a severe septal deviation to the left by blocking his nose approximately 90%, history of sports activities, turbinate hypertrophy as well with the concha bullosa on the right.  His oral cavity, the uvula was very long, palate is low, he has the right tonsillar papilloma.  His larynx is clear.  True cords, false cords, epiglottis, base of tongue, lateral pharyngeal walls are clear of ulceration, mass, or lesion.  True cord mobility, gag reflex, tongue mobility, EOMs, facial nerve are all symmetrical. NECK:  Free of any thyromegaly, cervical adenopathy or mass.  No salivary gland abnormalities. CHEST:  Clear.  No rales, rhonchi, or wheezes. CARDIOVASCULAR:  Normal S1 and S2.  No murmurs or gallops. EXTREMITIES:   Unremarkable.  His operative course was unremarkable.  His postoperative course was unremarkable.  He was monitored closely.  He had 1 episode of desaturation to 76%, but his nasal airway is fairly open and his oral cavity, showing a moderate to minimal swelling.  He will be seen by me in the office tomorrow and follow up will be further in the past 5 days and then 10 days, 3 weeks, 6 weeks, 3 months.  His EKG was also within normal limits.  Regular sinus rhythm.  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  CONDITION:  Improved.  DISCHARGE MEDICATIONS: 1. Lortab Elixir 7.5/500, 1-2 teaspoons q 4-6 h. p.r.n. 2. Keflex 250 mg p.o. b.i.d.          ______________________________ Hermelinda Medicus, M.D.     JC/MEDQ  D:  06/11/2011  T:  06/11/2011  Job:  [id number]

## 2011-06-26 DIAGNOSIS — K648 Other hemorrhoids: Secondary | ICD-10-CM | POA: Diagnosis not present

## 2011-08-12 DIAGNOSIS — Z23 Encounter for immunization: Secondary | ICD-10-CM | POA: Diagnosis not present

## 2011-08-12 DIAGNOSIS — E785 Hyperlipidemia, unspecified: Secondary | ICD-10-CM | POA: Diagnosis not present

## 2011-08-12 DIAGNOSIS — E559 Vitamin D deficiency, unspecified: Secondary | ICD-10-CM | POA: Diagnosis not present

## 2011-12-03 DIAGNOSIS — J0100 Acute maxillary sinusitis, unspecified: Secondary | ICD-10-CM | POA: Diagnosis not present

## 2012-02-10 DIAGNOSIS — K644 Residual hemorrhoidal skin tags: Secondary | ICD-10-CM | POA: Diagnosis not present

## 2012-02-10 DIAGNOSIS — M720 Palmar fascial fibromatosis [Dupuytren]: Secondary | ICD-10-CM | POA: Diagnosis not present

## 2012-02-10 DIAGNOSIS — M109 Gout, unspecified: Secondary | ICD-10-CM | POA: Diagnosis not present

## 2012-02-10 DIAGNOSIS — E785 Hyperlipidemia, unspecified: Secondary | ICD-10-CM | POA: Diagnosis not present

## 2012-02-28 DIAGNOSIS — H2510 Age-related nuclear cataract, unspecified eye: Secondary | ICD-10-CM | POA: Diagnosis not present

## 2012-05-13 IMAGING — CR DG CHEST 2V
2 series · 2 of 2 positions shown · non-contrast
Comparison: None.

CLINICAL DATA: Preop for tonsillar polyp

CHEST - 2 VIEW

[view not recorded (1 of 2)]
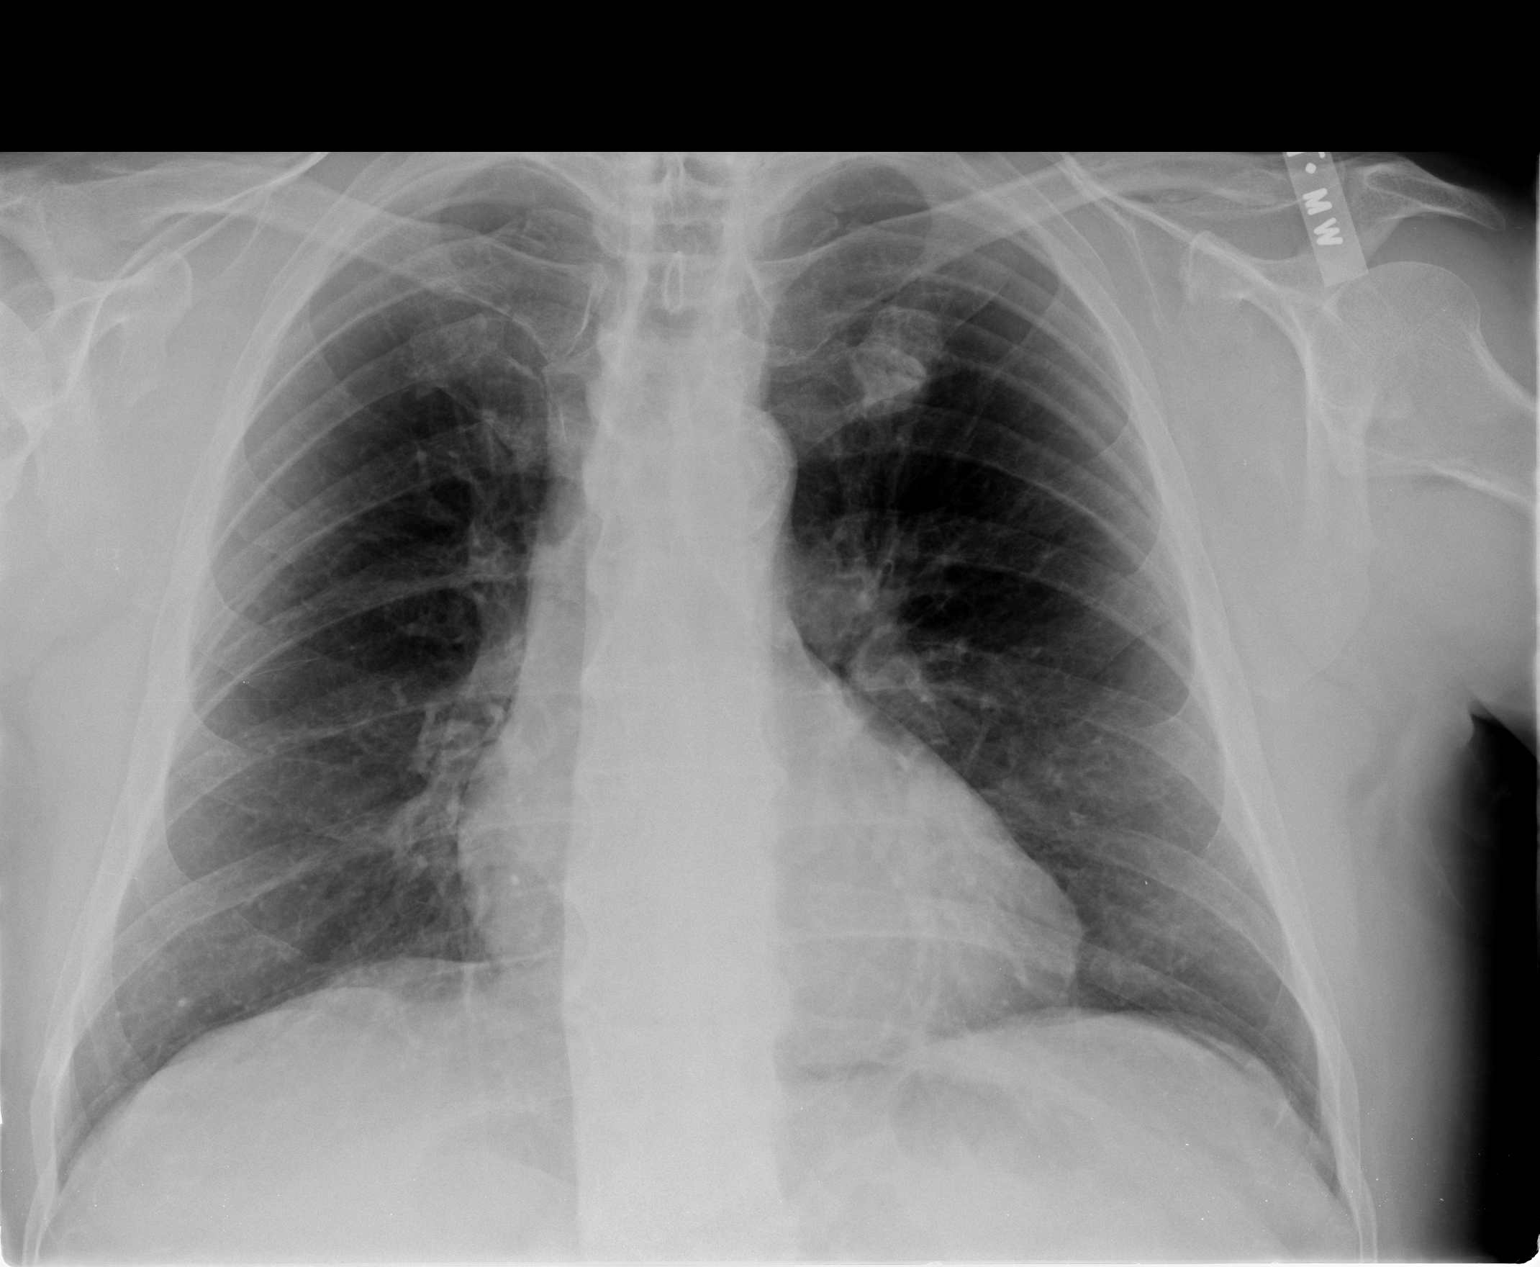

[view not recorded (2 of 2)]
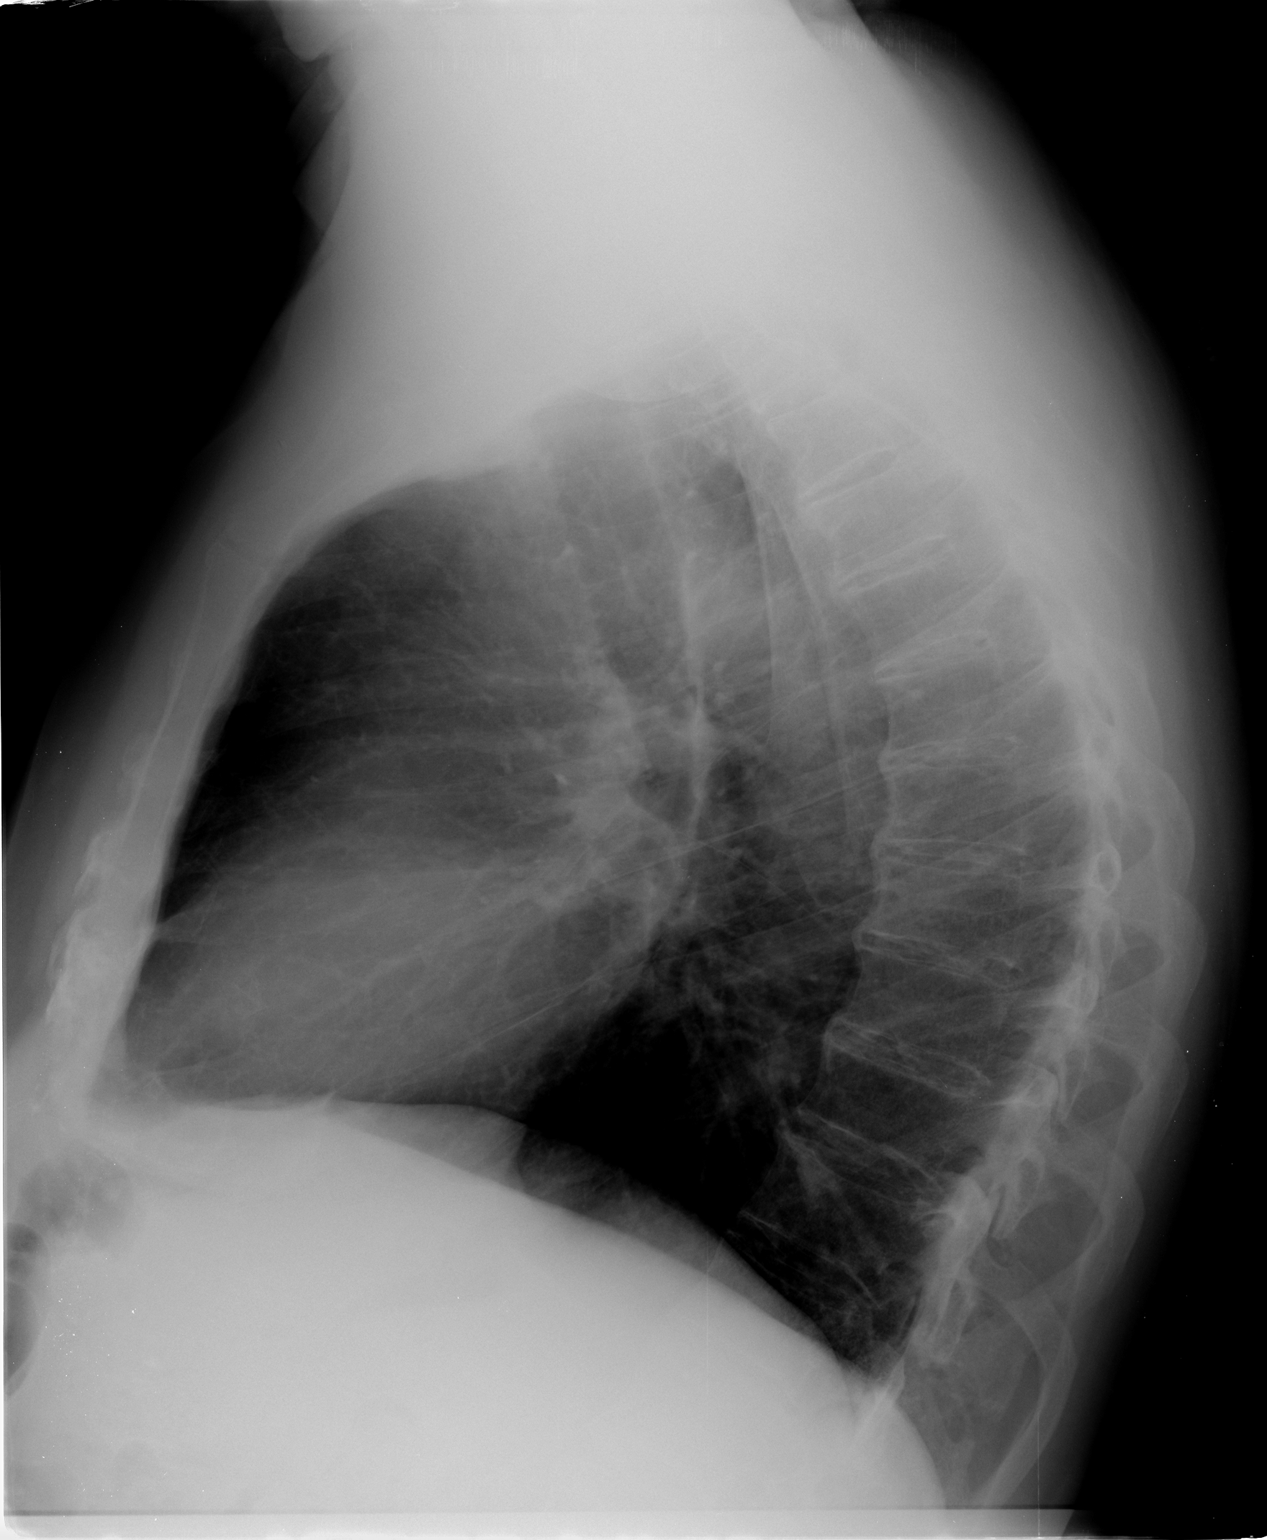

[2 of 2 positions shown; findings below may reference images not displayed]

FINDINGS: Cardiomediastinal silhouette is unremarkable.  No acute
infiltrate or pleural effusion.  No pulmonary edema.  Mild
degenerative changes thoracic spine.
IMPRESSION: No active disease.  Mild degenerative changes thoracic spine.

## 2012-05-21 ENCOUNTER — Other Ambulatory Visit: Payer: Self-pay | Admitting: Dermatology

## 2012-05-21 DIAGNOSIS — D485 Neoplasm of uncertain behavior of skin: Secondary | ICD-10-CM | POA: Diagnosis not present

## 2012-05-21 DIAGNOSIS — L723 Sebaceous cyst: Secondary | ICD-10-CM | POA: Diagnosis not present

## 2012-05-21 DIAGNOSIS — L821 Other seborrheic keratosis: Secondary | ICD-10-CM | POA: Diagnosis not present

## 2012-05-21 DIAGNOSIS — D2370 Other benign neoplasm of skin of unspecified lower limb, including hip: Secondary | ICD-10-CM | POA: Diagnosis not present

## 2012-06-23 ENCOUNTER — Other Ambulatory Visit: Payer: Self-pay | Admitting: Dermatology

## 2012-06-23 DIAGNOSIS — L723 Sebaceous cyst: Secondary | ICD-10-CM | POA: Diagnosis not present

## 2012-06-23 DIAGNOSIS — D485 Neoplasm of uncertain behavior of skin: Secondary | ICD-10-CM | POA: Diagnosis not present

## 2012-07-07 DIAGNOSIS — L259 Unspecified contact dermatitis, unspecified cause: Secondary | ICD-10-CM | POA: Diagnosis not present

## 2012-07-07 DIAGNOSIS — J342 Deviated nasal septum: Secondary | ICD-10-CM | POA: Diagnosis not present

## 2012-07-07 DIAGNOSIS — Z4802 Encounter for removal of sutures: Secondary | ICD-10-CM | POA: Diagnosis not present

## 2013-03-03 DIAGNOSIS — H2510 Age-related nuclear cataract, unspecified eye: Secondary | ICD-10-CM | POA: Diagnosis not present

## 2013-03-03 DIAGNOSIS — H43819 Vitreous degeneration, unspecified eye: Secondary | ICD-10-CM | POA: Diagnosis not present

## 2013-06-04 DIAGNOSIS — L259 Unspecified contact dermatitis, unspecified cause: Secondary | ICD-10-CM | POA: Diagnosis not present

## 2013-06-04 DIAGNOSIS — L821 Other seborrheic keratosis: Secondary | ICD-10-CM | POA: Diagnosis not present

## 2013-06-04 DIAGNOSIS — L408 Other psoriasis: Secondary | ICD-10-CM | POA: Diagnosis not present

## 2013-06-04 DIAGNOSIS — D235 Other benign neoplasm of skin of trunk: Secondary | ICD-10-CM | POA: Diagnosis not present

## 2013-09-01 DIAGNOSIS — L219 Seborrheic dermatitis, unspecified: Secondary | ICD-10-CM | POA: Diagnosis not present

## 2013-09-01 DIAGNOSIS — L738 Other specified follicular disorders: Secondary | ICD-10-CM | POA: Diagnosis not present

## 2013-10-11 ENCOUNTER — Ambulatory Visit (INDEPENDENT_AMBULATORY_CARE_PROVIDER_SITE_OTHER): Payer: Medicare Other | Admitting: Medical

## 2013-10-11 ENCOUNTER — Encounter: Payer: Self-pay | Admitting: Medical

## 2013-10-11 VITALS — BP 150/80 | HR 92 | Temp 98.3°F | Resp 16 | Ht 69.5 in | Wt 236.0 lb

## 2013-10-11 DIAGNOSIS — F17200 Nicotine dependence, unspecified, uncomplicated: Secondary | ICD-10-CM

## 2013-10-11 DIAGNOSIS — M549 Dorsalgia, unspecified: Secondary | ICD-10-CM | POA: Diagnosis not present

## 2013-10-11 DIAGNOSIS — R1011 Right upper quadrant pain: Secondary | ICD-10-CM

## 2013-10-11 LAB — BASIC METABOLIC PANEL
BUN: 12 mg/dL (ref 6–23)
CALCIUM: 9.7 mg/dL (ref 8.4–10.5)
CO2: 25 mEq/L (ref 19–32)
CREATININE: 0.97 mg/dL (ref 0.50–1.35)
Chloride: 100 mEq/L (ref 96–112)
Glucose, Bld: 107 mg/dL — ABNORMAL HIGH (ref 70–99)
Potassium: 4.4 mEq/L (ref 3.5–5.3)
Sodium: 135 mEq/L (ref 135–145)

## 2013-10-11 LAB — CBC WITH DIFFERENTIAL/PLATELET
BASOS ABS: 0 10*3/uL (ref 0.0–0.1)
Basophils Relative: 0 % (ref 0–1)
EOS ABS: 0.4 10*3/uL (ref 0.0–0.7)
EOS PCT: 5 % (ref 0–5)
HCT: 47.8 % (ref 39.0–52.0)
Hemoglobin: 16.6 g/dL (ref 13.0–17.0)
Lymphocytes Relative: 28 % (ref 12–46)
Lymphs Abs: 2.4 10*3/uL (ref 0.7–4.0)
MCH: 31.3 pg (ref 26.0–34.0)
MCHC: 34.7 g/dL (ref 30.0–36.0)
MCV: 90.2 fL (ref 78.0–100.0)
Monocytes Absolute: 0.6 10*3/uL (ref 0.1–1.0)
Monocytes Relative: 7 % (ref 3–12)
Neutro Abs: 5.2 10*3/uL (ref 1.7–7.7)
Neutrophils Relative %: 60 % (ref 43–77)
PLATELETS: 214 10*3/uL (ref 150–400)
RBC: 5.3 MIL/uL (ref 4.22–5.81)
RDW: 13.9 % (ref 11.5–15.5)
WBC: 8.7 10*3/uL (ref 4.0–10.5)

## 2013-10-11 LAB — HEPATIC FUNCTION PANEL
ALK PHOS: 72 U/L (ref 39–117)
ALT: 33 U/L (ref 0–53)
AST: 26 U/L (ref 0–37)
Albumin: 5 g/dL (ref 3.5–5.2)
BILIRUBIN DIRECT: 0.1 mg/dL (ref 0.0–0.3)
BILIRUBIN INDIRECT: 0.7 mg/dL (ref 0.2–1.2)
BILIRUBIN TOTAL: 0.8 mg/dL (ref 0.2–1.2)
TOTAL PROTEIN: 8 g/dL (ref 6.0–8.3)

## 2013-10-11 NOTE — Progress Notes (Signed)
Subjective:   Kyle Beck is a 68 y.o. male presenting on 10/11/2013 with right side under rib area pain  Here accompanied by wife.  Here as a new patient today.  Was seeing [facility] prior.   Been having RUQ pain.  Pain x 3-4 months, but intermittent in nature.  But in the last few days, more constant pain.  Yesterday pain all day long.  Deep breathing, coughing, sneezing, can aggravate the pain.  Sitting seems to put pressure on the area.  Not particularly worse after eating meals.  Sometimes when pulling on his boots can get sharp RUQ pain.  Standing is a little better than sitting.    Using nothing for this.     He is a smoker, 55years, 1 or up to 2 ppd.   Drinks sometimes none, sometimes 6-8 beers per week.    No prior cardiology.  No other aggravating or relieving factors.  No other complaint.  Past Medical History  Diagnosis Date  . Shortness of breath     exertional  . Sleep apnea   . Tonsillar hypertrophy, unilateral   . Benign colon polyp   . Arthritis     hips and spine   Past Surgical History  Procedure Laterality Date  . Eye surgery    . Refractive surgery  1994  . Appendectomy  2010  . Femur soft tissue tumor excision      states growth removed-done 06/04/2011-has incision still has stitches in  . Uvulopalatopharyngoplasty  06/10/2011    Procedure: UVULOPALATOPHARYNGOPLASTY (UPPP);  Surgeon: Thornell Sartorius, MD;  Location: [place];  Service: ENT;  Laterality: N/A;  . Polypectomy  06/10/2011    Procedure: POLYPECTOMY;  Surgeon: Thornell Sartorius, MD;  Location: [place];  Service: ENT;  Laterality: Right;  right tonsilar polypectomy  . Colonoscopy  2013    Dr. Earlean Shawl    Review of Systems Constitutional: -fever, -chills, -sweats, -unexpected weight change, -fatigue ENT: -runny nose, -ear pain, -sore throat Cardiology:  -chest pain, -palpitations, -edema Respiratory: -cough, -shortness of breath, -wheezing Gastroenterology: +RUQ abdominal pain, +nausea,  -vomiting, +periodically has diarrhea, -constipation  Hematology: -bleeding or bruising problems Musculoskeletal: +arthralgias, -myalgias, -joint swelling, +occasional back pain Ophthalmology: -vision changes Urology: -dysuria, -difficulty urinating, -hematuria, -urinary frequency, -urgency Neurology: -headache, -weakness, +occasional tingling in fingers, -numbness      Objective:     BP 150/80  Pulse 92  Temp(Src) 98.3 F (36.8 C) (Oral)  Resp 16  Ht 5' 9.5" (1.765 m)  Wt 236 lb (107.049 kg)  BMI 34.36 kg/m2  General appearance: alert, no distress, WD/WN, obese white male Oral cavity: MMM, no lesions Neck: supple, no lymphadenopathy, no thyromegaly, no masses Heart: RRR, normal S1, S2, no murmurs Lungs: CTA bilaterally, no wheezes, rhonchi, or rales Abdomen: small surgical scars of supraumbilical region and one central lower abdomen, +bs, soft, slight RUQ tenderness, non tender, non distended, no masses, no hepatomegaly, no splenomegaly Pulses: 2+ symmetric, upper and lower extremities, normal cap refill Back and chest wall nontender Ext: no edema Pulses normal     Assessment: Encounter Diagnoses  Name Primary?  . RUQ abdominal pain Yes  . Smoker   . Back pain      Plan: Discussed his concerns, symptoms, possible causes of RUQ abdomina pain.  He is a long term smoker.  Exam mostly unremarkable.  F/u pending studies  [name] was seen today for right side under rib area pain.  Diagnoses and associated orders for  this visit:  RUQ abdominal pain - CBC with Differential - Basic metabolic panel - Hepatic Function Panel - US Abdomen Limited RUQ; Future - DG Chest 2 View; Future  Smoker - CBC with Differential - Basic metabolic panel - Hepatic Function Panel - US Abdomen Limited RUQ; Future - DG Chest 2 View; Future  Back pain - CBC with Differential - Basic metabolic panel - Hepatic Function Panel - US Abdomen Limited RUQ; Future - DG Chest 2 View;  Future     Return pending studies.

## 2013-10-15 ENCOUNTER — Ambulatory Visit
Admission: RE | Admit: 2013-10-15 | Discharge: 2013-10-15 | Disposition: A | Discharge status: Home / Self Care | Payer: Medicare Other | Source: Ambulatory Visit | Attending: Medical | Admitting: Medical

## 2013-10-15 DIAGNOSIS — R1011 Right upper quadrant pain: Secondary | ICD-10-CM

## 2013-10-15 DIAGNOSIS — R059 Cough, unspecified: Secondary | ICD-10-CM | POA: Diagnosis not present

## 2013-10-15 DIAGNOSIS — F17200 Nicotine dependence, unspecified, uncomplicated: Secondary | ICD-10-CM

## 2013-10-15 DIAGNOSIS — M549 Dorsalgia, unspecified: Secondary | ICD-10-CM

## 2013-10-15 DIAGNOSIS — R0602 Shortness of breath: Secondary | ICD-10-CM | POA: Diagnosis not present

## 2013-10-15 DIAGNOSIS — R05 Cough: Secondary | ICD-10-CM | POA: Diagnosis not present

## 2013-10-25 ENCOUNTER — Ambulatory Visit (INDEPENDENT_AMBULATORY_CARE_PROVIDER_SITE_OTHER): Payer: Medicare Other | Admitting: Medical

## 2013-10-25 ENCOUNTER — Encounter: Payer: Self-pay | Admitting: Medical

## 2013-10-25 VITALS — BP 130/70 | HR 62 | Temp 98.1°F | Resp 14 | Wt 237.0 lb

## 2013-10-25 DIAGNOSIS — F17200 Nicotine dependence, unspecified, uncomplicated: Secondary | ICD-10-CM | POA: Diagnosis not present

## 2013-10-25 DIAGNOSIS — R1011 Right upper quadrant pain: Secondary | ICD-10-CM

## 2013-10-25 DIAGNOSIS — K760 Fatty (change of) liver, not elsewhere classified: Secondary | ICD-10-CM

## 2013-10-25 DIAGNOSIS — K7689 Other specified diseases of liver: Secondary | ICD-10-CM

## 2013-10-25 NOTE — Progress Notes (Signed)
Subjective:   Kyle Beck is a 68 y.o. male presenting on 10/25/2013 with Follow-up  The patient came in as a new patient in June for right upper quadrant pain.  At that time he had been having pain for x 3-4 months, right upper quadrant, intermittent at first but it had worsened.  At that time aggravating factors included deep breathing, coughing, sneezing.  Sitting seemed to put pressure on the area.  Not particularly worse after eating meals.  Sometimes when pulling on his boots can get sharp RUQ pain.  Standing is a little better than sitting.    Using nothing for this.   However, since last visit he notes that the very next day after he saw me he had a period of time where he belched continuously and the pain went away and has not recurred. He feels fine.  He did go for the ultrasound and chest x-ray  He is a smoker, 55 years, 1 or up to 2 ppd.   Drinks sometimes none, sometimes up to12 beers per week.    No prior cardiology.  No other aggravating or relieving factors.  No other complaint.  Past Medical History  Diagnosis Date  . Shortness of breath     exertional  . Sleep apnea   . Tonsillar hypertrophy, unilateral   . Benign colon polyp   . Arthritis     hips and spine   Past Surgical History  Procedure Laterality Date  . Eye surgery    . Refractive surgery  1994  . Appendectomy  2010  . Femur soft tissue tumor excision      states growth removed-done 06/04/2011-has incision still has stitches in  . Uvulopalatopharyngoplasty  06/10/2011    Procedure: UVULOPALATOPHARYNGOPLASTY (UPPP);  Surgeon: Thornell Sartorius, MD;  Location: [place];  Service: ENT;  Laterality: N/A;  . Polypectomy  06/10/2011    Procedure: POLYPECTOMY;  Surgeon: Thornell Sartorius, MD;  Location: [place];  Service: ENT;  Laterality: Right;  right tonsilar polypectomy  . Colonoscopy  2013    Dr. Earlean Shawl    Review of Systems Constitutional: -fever, -chills, -sweats, -unexpected weight change, -fatigue ENT:  -runny nose, -ear pain, -sore throat Cardiology:  -chest pain, -palpitations, -edema Respiratory: -cough, -shortness of breath, -wheezing Hematology: -bleeding or bruising problems Musculoskeletal: -arthralgias, -myalgias, -joint swelling, -occasional back pain Ophthalmology: -vision changes Urology: -dysuria, -difficulty urinating, -hematuria, -urinary frequency, -urgency Neurology: -headache, -weakness, +occasional tingling in fingers, -numbness      Objective:     BP 130/70  Pulse 62  Temp(Src) 98.1 F (36.7 C) (Oral)  Resp 14  Wt 237 lb (107.502 kg)  General appearance: alert, no distress, WD/WN, obese white male Oral cavity: MMM, no lesions Neck: supple, no lymphadenopathy, no thyromegaly, no masses Heart: RRR, normal S1, S2, no murmurs Lungs: CTA bilaterally, no wheezes, rhonchi, or rales Abdomen: small surgical scars of supraumbilical region and one central lower abdomen, +bs, soft, no tenderness, non tender, non distended, no masses, no hepatomegaly, no splenomegaly Pulses: 2+ symmetric, upper and lower extremities, normal cap refill Ext: no edema Pulses normal     Assessment: Encounter Diagnoses  Name Primary?  . RUQ abdominal pain Yes  . Fatty liver   . Tobacco use disorder      Plan: We reviewed his recent lab work which was normal, reviewed the chest x-ray which was normal, reviewed the ultrasound of the abdomen which showed fatty liver disease.  We did discuss the diagnosis of  fatty liver disease and tobacco use.  He is not interested at all in quitting smoking.  We did discuss possible complications or health problems associated with tobacco use, fatty liver disease, obesity.   We discussed my recommendations for tobacco cessation, weight loss, healthier diet to address both long-term effects of tobacco use as well as fatty liver disease.  We also discussed EGD at baseline since he does have a history of tobacco use and a decent amount of alcohol use.   He will consider and let me know.  He has seen Dr. Earlean Shawl in the past for colonoscopy  Kyle Beck was seen today for follow-up.  Diagnoses and associated orders for this visit:  RUQ abdominal pain  Fatty liver  Tobacco use disorder     Return if symptoms worsen or fail to improve.

## 2014-01-25 ENCOUNTER — Ambulatory Visit (INDEPENDENT_AMBULATORY_CARE_PROVIDER_SITE_OTHER): Payer: Medicare Other | Admitting: Family Medicine

## 2014-01-25 ENCOUNTER — Encounter: Payer: Self-pay | Admitting: Family Medicine

## 2014-01-25 VITALS — BP 130/90 | HR 67 | Wt 237.0 lb

## 2014-01-25 DIAGNOSIS — M7042 Prepatellar bursitis, left knee: Secondary | ICD-10-CM

## 2014-01-25 DIAGNOSIS — M7040 Prepatellar bursitis, unspecified knee: Secondary | ICD-10-CM

## 2014-01-25 DIAGNOSIS — Z23 Encounter for immunization: Secondary | ICD-10-CM

## 2014-01-25 NOTE — Progress Notes (Signed)
   Subjective:    Patient ID: Kyle Beck, male    DOB: [birth year], 68 y.o.   MRN: [id number]  HPI Approximately 4 days ago he noted the onset of left knee pain and swelling over the tibial tuberosity. He did use some conservative care for this and states the swelling and discomfort are diminished. No history of injury or overuse. No other joints   Review of Systems     Objective:   Physical Exam Slight erythema and warmth is noted over the tibial tuberosity with pain on palpation. The joint shows no effusion and is nontender with good motion.       Assessment & Plan:  Prepatellar bursitis, left  Need for prophylactic vaccination and inoculation against influenza - Plan: Flu vaccine HIGH DOSE PF (Fluzone Tri High dose)  will treat conservatively with heat and ibuprofen. Instructed to return here if the redness and swelling gets worse.

## 2014-01-25 NOTE — Patient Instructions (Signed)
4 ibuprofen 3 times per day. Heat to the area for 20 minutes 3 times per

## 2014-02-22 DIAGNOSIS — H43813 Vitreous degeneration, bilateral: Secondary | ICD-10-CM | POA: Diagnosis not present

## 2014-02-22 DIAGNOSIS — H18413 Arcus senilis, bilateral: Secondary | ICD-10-CM | POA: Diagnosis not present

## 2014-02-22 DIAGNOSIS — H2513 Age-related nuclear cataract, bilateral: Secondary | ICD-10-CM | POA: Diagnosis not present

## 2014-02-22 DIAGNOSIS — H11153 Pinguecula, bilateral: Secondary | ICD-10-CM | POA: Diagnosis not present

## 2014-02-22 DIAGNOSIS — H04123 Dry eye syndrome of bilateral lacrimal glands: Secondary | ICD-10-CM | POA: Diagnosis not present

## 2014-06-10 DIAGNOSIS — L409 Psoriasis, unspecified: Secondary | ICD-10-CM | POA: Diagnosis not present

## 2014-06-10 DIAGNOSIS — L723 Sebaceous cyst: Secondary | ICD-10-CM | POA: Diagnosis not present

## 2014-06-10 DIAGNOSIS — L300 Nummular dermatitis: Secondary | ICD-10-CM | POA: Diagnosis not present

## 2014-06-10 DIAGNOSIS — D2372 Other benign neoplasm of skin of left lower limb, including hip: Secondary | ICD-10-CM | POA: Diagnosis not present

## 2014-06-10 DIAGNOSIS — L821 Other seborrheic keratosis: Secondary | ICD-10-CM | POA: Diagnosis not present

## 2014-06-10 DIAGNOSIS — D2271 Melanocytic nevi of right lower limb, including hip: Secondary | ICD-10-CM | POA: Diagnosis not present

## 2014-09-01 ENCOUNTER — Ambulatory Visit (INDEPENDENT_AMBULATORY_CARE_PROVIDER_SITE_OTHER): Payer: Medicare Other | Admitting: Family Medicine

## 2014-09-01 ENCOUNTER — Encounter: Payer: Self-pay | Admitting: Family Medicine

## 2014-09-01 VITALS — BP 140/78 | HR 55 | Ht 70.0 in | Wt 241.6 lb

## 2014-09-01 DIAGNOSIS — L309 Dermatitis, unspecified: Secondary | ICD-10-CM

## 2014-09-01 DIAGNOSIS — Z122 Encounter for screening for malignant neoplasm of respiratory organs: Secondary | ICD-10-CM

## 2014-09-01 DIAGNOSIS — Z8719 Personal history of other diseases of the digestive system: Secondary | ICD-10-CM

## 2014-09-01 DIAGNOSIS — Z23 Encounter for immunization: Secondary | ICD-10-CM | POA: Diagnosis not present

## 2014-09-01 DIAGNOSIS — Z136 Encounter for screening for cardiovascular disorders: Secondary | ICD-10-CM | POA: Diagnosis not present

## 2014-09-01 DIAGNOSIS — E669 Obesity, unspecified: Secondary | ICD-10-CM | POA: Diagnosis not present

## 2014-09-01 DIAGNOSIS — K648 Other hemorrhoids: Secondary | ICD-10-CM

## 2014-09-01 DIAGNOSIS — Z87891 Personal history of nicotine dependence: Secondary | ICD-10-CM | POA: Diagnosis not present

## 2014-09-01 DIAGNOSIS — M25551 Pain in right hip: Secondary | ICD-10-CM

## 2014-09-01 NOTE — Progress Notes (Signed)
   Subjective:    Patient ID: Kyle Beck, male    DOB: [birth year], 69 y.o.   MRN: [id number]  HPI He is here for an interval evaluation. He does have a history of eczema which does cause difficulty with itching. He treats this with OTC medications. He has a previous history of vocal cord polyp and subsequent surgery as well as nasal surgery. He still does complain of some PND and occasional bleeding. He has a previous history of internal hemorrhoids requiring banding. He also has a history of rectal fissure. Occasionally both of these can sometimes cause intermittent bleeding. He has seen Dr. Earlean Shawl in the past for this. He also complains mainly of right hip pain but also bilateral knee pain. He is scheduled to see Dr. Lorin Mercy about these. The hip pain is been especially bothersome, interfering with his playing golf. He continues to smoke and at this point is not interested in quitting. Family and social history as well as immunizations were reviewed.   Review of Systems     Objective:   Physical Exam Alert and in no distress. Tympanic membranes and canals are normal. Pharyngeal area is normal. Neck is supple without adenopathy or thyromegaly. Cardiac exam shows a regular sinus rhythm without murmurs or gallops. Lungs are clear to auscultation.        Assessment & Plan:  Screening for AAA (abdominal aortic aneurysm) - Plan: US Aorta Initial Medicare Screen  Need for prophylactic vaccination against Streptococcus pneumoniae (pneumococcus) - Plan: Pneumococcal conjugate vaccine 13-valent  Screening for malignant neoplasm of respiratory organ - Plan: CT CHEST LUNG CA SCREEN LOW DOSE W/O CM  Personal history of tobacco use, presenting hazards to health - Plan: CT CHEST LUNG CA SCREEN LOW DOSE W/O CM  Eczema  Internal hemorrhoids  History of rectal fissure  Right hip pain  Obesity  Shared Decision Making Visit Lung Cancer Screening Program    [id number])   Eligibility:  Age  73 y.o.  Pack Years Smoking History Calculation 60 (# packs/per year x # years smoked)  Recent History of coughing up blood  NO:22349}  Unexplained weight loss? no ( >Than 15 pounds within the last 6 months )  Prior History Lung / other cancer  NO:22349} (Diagnosis within the last 5 years already requiring surveillance chest CT Scans).  Smoking Status Current smoker  Former Smokers: Years since quit: n/a  Quit Date:n/a  Visit Components:  Discussion included one or more decision making aids. {YES  Discussion included risk/benefits of screening. {YES  Discussion included potential follow up diagnostic testing for abnormal scans. {YES  Discussion included meaning and risk of over diagnosis. {YES Discussion included meaning and risk of False Positives. {YES Discussion included meaning of total radiation exposure. {YES Counseling Included:  Importance of adherence to annual lung cancer LDCT screening. {YES  Impact of comorbidities on ability to participate in the program.     Written Order for Lung Cancer Screening with LDCT placed in Epic.  (CT Chest Lung Cancer Screening Low Dose W/O CM) [id number] Z12.2-Screening of respiratory organs Z87.891-Personal history of nicotine dependence   Wyatt Haste, MD  Discussed the need for him to quit smoking at this time he is not interested. He will follow-up with Dr. Lorin Mercy concerning his knee pain. Also discussed treatment of his intermittent bleeding and at this time he is not interested in pursuing this further. I did not discussed weight reduction at this visit.

## 2014-09-01 NOTE — Progress Notes (Signed)
Pt. Has an appt. With [facility] for Ultrasound of Aorta. Spoke directly with patient.  Will also mail informtion.

## 2014-09-02 NOTE — Progress Notes (Signed)
Pt has CT chest scheduled for Friday may 13th @ 12:40pm @ [facility]. [place] No prior Josem Kaufmann is required. Pt is aware

## 2014-09-08 ENCOUNTER — Ambulatory Visit
Admission: RE | Admit: 2014-09-08 | Discharge: 2014-09-08 | Disposition: A | Discharge status: Home / Self Care | Payer: Medicare Other | Source: Ambulatory Visit | Attending: Family Medicine | Admitting: Family Medicine

## 2014-09-08 DIAGNOSIS — Z136 Encounter for screening for cardiovascular disorders: Secondary | ICD-10-CM

## 2014-09-09 ENCOUNTER — Ambulatory Visit
Admission: RE | Admit: 2014-09-09 | Discharge: 2014-09-09 | Disposition: A | Discharge status: Home / Self Care | Payer: Medicare Other | Source: Ambulatory Visit | Attending: Family Medicine | Admitting: Family Medicine

## 2014-09-09 DIAGNOSIS — Z87891 Personal history of nicotine dependence: Secondary | ICD-10-CM | POA: Diagnosis not present

## 2014-09-09 DIAGNOSIS — Z122 Encounter for screening for malignant neoplasm of respiratory organs: Secondary | ICD-10-CM | POA: Diagnosis not present

## 2014-09-14 ENCOUNTER — Other Ambulatory Visit: Payer: Self-pay | Admitting: Family Medicine

## 2014-09-14 DIAGNOSIS — F17200 Nicotine dependence, unspecified, uncomplicated: Secondary | ICD-10-CM

## 2014-09-16 DIAGNOSIS — M25551 Pain in right hip: Secondary | ICD-10-CM | POA: Diagnosis not present

## 2014-09-16 DIAGNOSIS — M25552 Pain in left hip: Secondary | ICD-10-CM | POA: Diagnosis not present

## 2014-09-16 DIAGNOSIS — M25562 Pain in left knee: Secondary | ICD-10-CM | POA: Diagnosis not present

## 2014-09-16 DIAGNOSIS — M25561 Pain in right knee: Secondary | ICD-10-CM | POA: Diagnosis not present

## 2015-01-10 DIAGNOSIS — L723 Sebaceous cyst: Secondary | ICD-10-CM | POA: Diagnosis not present

## 2015-01-10 DIAGNOSIS — D2371 Other benign neoplasm of skin of right lower limb, including hip: Secondary | ICD-10-CM | POA: Diagnosis not present

## 2015-02-22 DIAGNOSIS — L708 Other acne: Secondary | ICD-10-CM | POA: Diagnosis not present

## 2015-02-22 DIAGNOSIS — M720 Palmar fascial fibromatosis [Dupuytren]: Secondary | ICD-10-CM | POA: Diagnosis not present

## 2015-02-22 DIAGNOSIS — H35361 Drusen (degenerative) of macula, right eye: Secondary | ICD-10-CM | POA: Diagnosis not present

## 2015-03-07 DIAGNOSIS — H04123 Dry eye syndrome of bilateral lacrimal glands: Secondary | ICD-10-CM | POA: Diagnosis not present

## 2015-03-14 DIAGNOSIS — M25551 Pain in right hip: Secondary | ICD-10-CM | POA: Diagnosis not present

## 2015-03-14 DIAGNOSIS — M25552 Pain in left hip: Secondary | ICD-10-CM | POA: Diagnosis not present

## 2015-03-14 DIAGNOSIS — Z23 Encounter for immunization: Secondary | ICD-10-CM | POA: Diagnosis not present

## 2015-06-07 DIAGNOSIS — J342 Deviated nasal septum: Secondary | ICD-10-CM | POA: Diagnosis not present

## 2015-06-07 DIAGNOSIS — R0683 Snoring: Secondary | ICD-10-CM | POA: Diagnosis not present

## 2015-06-07 DIAGNOSIS — J322 Chronic ethmoidal sinusitis: Secondary | ICD-10-CM | POA: Diagnosis not present

## 2015-06-07 DIAGNOSIS — J320 Chronic maxillary sinusitis: Secondary | ICD-10-CM | POA: Diagnosis not present

## 2015-06-07 DIAGNOSIS — J370 Chronic laryngitis: Secondary | ICD-10-CM | POA: Diagnosis not present

## 2015-06-20 DIAGNOSIS — D2271 Melanocytic nevi of right lower limb, including hip: Secondary | ICD-10-CM | POA: Diagnosis not present

## 2015-06-20 DIAGNOSIS — L723 Sebaceous cyst: Secondary | ICD-10-CM | POA: Diagnosis not present

## 2015-06-20 DIAGNOSIS — L720 Epidermal cyst: Secondary | ICD-10-CM | POA: Diagnosis not present

## 2015-06-20 DIAGNOSIS — Z23 Encounter for immunization: Secondary | ICD-10-CM | POA: Diagnosis not present

## 2015-06-20 DIAGNOSIS — L821 Other seborrheic keratosis: Secondary | ICD-10-CM | POA: Diagnosis not present

## 2015-06-20 DIAGNOSIS — L308 Other specified dermatitis: Secondary | ICD-10-CM | POA: Diagnosis not present

## 2015-06-20 DIAGNOSIS — L219 Seborrheic dermatitis, unspecified: Secondary | ICD-10-CM | POA: Diagnosis not present

## 2015-08-08 DIAGNOSIS — H25011 Cortical age-related cataract, right eye: Secondary | ICD-10-CM | POA: Diagnosis not present

## 2015-08-08 DIAGNOSIS — H25012 Cortical age-related cataract, left eye: Secondary | ICD-10-CM | POA: Diagnosis not present

## 2015-08-08 DIAGNOSIS — H2512 Age-related nuclear cataract, left eye: Secondary | ICD-10-CM | POA: Diagnosis not present

## 2015-08-08 DIAGNOSIS — H2511 Age-related nuclear cataract, right eye: Secondary | ICD-10-CM | POA: Diagnosis not present

## 2015-08-28 ENCOUNTER — Other Ambulatory Visit: Payer: Self-pay | Admitting: Family Medicine

## 2015-08-28 DIAGNOSIS — Z122 Encounter for screening for malignant neoplasm of respiratory organs: Secondary | ICD-10-CM

## 2015-09-11 ENCOUNTER — Telehealth: Payer: Self-pay | Admitting: Family Medicine

## 2015-09-11 NOTE — Telephone Encounter (Signed)
Left word for word message on pt machine 

## 2015-09-11 NOTE — Telephone Encounter (Signed)
Let him know that he might be better off to go to the pharmacy to get a TDaP. I will write him a prescription for that.

## 2015-09-11 NOTE — Telephone Encounter (Signed)
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 Wife is concerned that he may need to come in for a tetnus shot since he is over due for one. Pt does not have any injuries right now. He did get some minor cuts & scrapes that does not require medical attention. Should pt come in for tentus shot?

## 2015-09-15 ENCOUNTER — Telehealth: Payer: Self-pay | Admitting: Family Medicine

## 2015-09-15 ENCOUNTER — Encounter: Payer: Self-pay | Admitting: Family Medicine

## 2015-09-15 ENCOUNTER — Ambulatory Visit
Admission: RE | Admit: 2015-09-15 | Discharge: 2015-09-15 | Disposition: A | Discharge status: Home / Self Care | Payer: Medicare Other | Source: Ambulatory Visit | Attending: Family Medicine | Admitting: Family Medicine

## 2015-09-15 DIAGNOSIS — I700 Atherosclerosis of aorta: Secondary | ICD-10-CM

## 2015-09-15 DIAGNOSIS — I2510 Atherosclerotic heart disease of native coronary artery without angina pectoris: Secondary | ICD-10-CM

## 2015-09-15 DIAGNOSIS — F17200 Nicotine dependence, unspecified, uncomplicated: Secondary | ICD-10-CM

## 2015-09-15 DIAGNOSIS — I2584 Coronary atherosclerosis due to calcified coronary lesion: Secondary | ICD-10-CM

## 2015-09-15 DIAGNOSIS — F17210 Nicotine dependence, cigarettes, uncomplicated: Secondary | ICD-10-CM | POA: Diagnosis not present

## 2015-09-15 NOTE — Telephone Encounter (Signed)
oo

## 2015-10-02 DIAGNOSIS — H2512 Age-related nuclear cataract, left eye: Secondary | ICD-10-CM | POA: Diagnosis not present

## 2015-10-02 DIAGNOSIS — H25812 Combined forms of age-related cataract, left eye: Secondary | ICD-10-CM | POA: Diagnosis not present

## 2015-10-03 DIAGNOSIS — H2511 Age-related nuclear cataract, right eye: Secondary | ICD-10-CM | POA: Diagnosis not present

## 2015-10-16 DIAGNOSIS — H2511 Age-related nuclear cataract, right eye: Secondary | ICD-10-CM | POA: Diagnosis not present

## 2015-11-09 DIAGNOSIS — M25562 Pain in left knee: Secondary | ICD-10-CM | POA: Diagnosis not present

## 2015-11-09 DIAGNOSIS — M25561 Pain in right knee: Secondary | ICD-10-CM | POA: Diagnosis not present

## 2015-11-09 DIAGNOSIS — R262 Difficulty in walking, not elsewhere classified: Secondary | ICD-10-CM | POA: Diagnosis not present

## 2015-11-09 DIAGNOSIS — M1711 Unilateral primary osteoarthritis, right knee: Secondary | ICD-10-CM | POA: Diagnosis not present

## 2015-11-09 DIAGNOSIS — M170 Bilateral primary osteoarthritis of knee: Secondary | ICD-10-CM | POA: Diagnosis not present

## 2015-11-16 DIAGNOSIS — M1711 Unilateral primary osteoarthritis, right knee: Secondary | ICD-10-CM | POA: Diagnosis not present

## 2015-11-16 DIAGNOSIS — M25561 Pain in right knee: Secondary | ICD-10-CM | POA: Diagnosis not present

## 2015-11-23 DIAGNOSIS — M25561 Pain in right knee: Secondary | ICD-10-CM | POA: Diagnosis not present

## 2015-11-23 DIAGNOSIS — M1711 Unilateral primary osteoarthritis, right knee: Secondary | ICD-10-CM | POA: Diagnosis not present

## 2015-11-28 ENCOUNTER — Other Ambulatory Visit: Payer: Self-pay

## 2015-11-28 DIAGNOSIS — R918 Other nonspecific abnormal finding of lung field: Secondary | ICD-10-CM

## 2015-11-28 DIAGNOSIS — M25561 Pain in right knee: Secondary | ICD-10-CM | POA: Diagnosis not present

## 2015-11-28 DIAGNOSIS — M1711 Unilateral primary osteoarthritis, right knee: Secondary | ICD-10-CM | POA: Diagnosis not present

## 2015-12-07 DIAGNOSIS — M25561 Pain in right knee: Secondary | ICD-10-CM | POA: Diagnosis not present

## 2015-12-07 DIAGNOSIS — M1711 Unilateral primary osteoarthritis, right knee: Secondary | ICD-10-CM | POA: Diagnosis not present

## 2015-12-14 ENCOUNTER — Telehealth: Payer: Self-pay | Admitting: Family Medicine

## 2015-12-14 NOTE — Telephone Encounter (Signed)
Pt called and states that he was suppose to go back and have a ct scan  Done every 6 months and was wondering if we was suppose to set them up for him or can he just call and set that up, stated that the last one showed something that was not good, pt can be reached at [phone] (M)

## 2015-12-14 NOTE — Telephone Encounter (Signed)
I called patient to inform him the ct is not due till nov and I would make the appointment in mid oct and let him know when the ct is scheduled pt verbalized understanding

## 2016-01-02 ENCOUNTER — Other Ambulatory Visit: Payer: Self-pay

## 2016-01-17 ENCOUNTER — Ambulatory Visit: Payer: Medicare Other | Admitting: Medical

## 2016-01-24 ENCOUNTER — Ambulatory Visit (INDEPENDENT_AMBULATORY_CARE_PROVIDER_SITE_OTHER): Payer: Medicare Other | Admitting: Family Medicine

## 2016-01-24 ENCOUNTER — Telehealth: Payer: Self-pay

## 2016-01-24 ENCOUNTER — Encounter: Payer: Self-pay | Admitting: Family Medicine

## 2016-01-24 VITALS — BP 148/80 | HR 88 | Resp 18 | Ht 69.0 in | Wt 230.4 lb

## 2016-01-24 DIAGNOSIS — I2584 Coronary atherosclerosis due to calcified coronary lesion: Secondary | ICD-10-CM | POA: Diagnosis not present

## 2016-01-24 DIAGNOSIS — E669 Obesity, unspecified: Secondary | ICD-10-CM | POA: Diagnosis not present

## 2016-01-24 DIAGNOSIS — Z23 Encounter for immunization: Secondary | ICD-10-CM | POA: Diagnosis not present

## 2016-01-24 DIAGNOSIS — Z87891 Personal history of nicotine dependence: Secondary | ICD-10-CM

## 2016-01-24 DIAGNOSIS — I2510 Atherosclerotic heart disease of native coronary artery without angina pectoris: Secondary | ICD-10-CM

## 2016-01-24 DIAGNOSIS — R55 Syncope and collapse: Secondary | ICD-10-CM | POA: Diagnosis not present

## 2016-01-24 DIAGNOSIS — I498 Other specified cardiac arrhythmias: Secondary | ICD-10-CM

## 2016-01-24 DIAGNOSIS — I700 Atherosclerosis of aorta: Secondary | ICD-10-CM

## 2016-01-24 LAB — CBC WITH DIFFERENTIAL/PLATELET
BASOS ABS: 0 {cells}/uL (ref 0–200)
Basophils Relative: 0 %
EOS ABS: 284 {cells}/uL (ref 15–500)
Eosinophils Relative: 4 %
HEMATOCRIT: 47.4 % (ref 38.5–50.0)
HEMOGLOBIN: 16.2 g/dL (ref 13.2–17.1)
LYMPHS ABS: 2059 {cells}/uL (ref 850–3900)
LYMPHS PCT: 29 %
MCH: 31.8 pg (ref 27.0–33.0)
MCHC: 34.2 g/dL (ref 32.0–36.0)
MCV: 92.9 fL (ref 80.0–100.0)
MONO ABS: 497 {cells}/uL (ref 200–950)
MPV: 10.6 fL (ref 7.5–12.5)
Monocytes Relative: 7 %
NEUTROS PCT: 60 %
Neutro Abs: 4260 cells/uL (ref 1500–7800)
Platelets: 220 10*3/uL (ref 140–400)
RBC: 5.1 MIL/uL (ref 4.20–5.80)
RDW: 13.7 % (ref 11.0–15.0)
WBC: 7.1 10*3/uL (ref 4.0–10.5)

## 2016-01-24 LAB — COMPREHENSIVE METABOLIC PANEL
ALBUMIN: 4.6 g/dL (ref 3.6–5.1)
ALK PHOS: 56 U/L (ref 40–115)
ALT: 26 U/L (ref 9–46)
AST: 21 U/L (ref 10–35)
BILIRUBIN TOTAL: 0.5 mg/dL (ref 0.2–1.2)
BUN: 14 mg/dL (ref 7–25)
CALCIUM: 10 mg/dL (ref 8.6–10.3)
CO2: 26 mmol/L (ref 20–31)
Chloride: 103 mmol/L (ref 98–110)
Creat: 1.05 mg/dL (ref 0.70–1.18)
Glucose, Bld: 104 mg/dL — ABNORMAL HIGH (ref 65–99)
POTASSIUM: 4.4 mmol/L (ref 3.5–5.3)
Sodium: 138 mmol/L (ref 135–146)
Total Protein: 7.3 g/dL (ref 6.1–8.1)

## 2016-01-24 LAB — LIPID PANEL
CHOL/HDL RATIO: 5.7 ratio — AB (ref <=5.0)
Cholesterol: 205 mg/dL — ABNORMAL HIGH (ref 125–200)
HDL: 36 mg/dL — AB (ref >=40)
LDL CALC: 151 mg/dL — AB (ref <130)
TRIGLYCERIDES: 90 mg/dL (ref <150)
VLDL: 18 mg/dL (ref <30)

## 2016-01-24 MED ORDER — PNEUMOCOCCAL VAC POLYVALENT 25 MCG/0.5ML IJ INJ: 0.5000 mL | INJECTION | INTRAMUSCULAR | Status: DC

## 2016-01-24 NOTE — Telephone Encounter (Signed)
Pt has been scheduled for Friday @ 8:45AM with Dr. Tamala Julian. [facility] on [address].  LM for pt to CB.

## 2016-01-24 NOTE — Progress Notes (Signed)
Kyle Beck is a 70 y.o. male who presents for annual wellness visit and follow-up on chronic medical conditions.  He has the following concerns: Very difficult to get a good history from this gentleman as he tended to ramble on and not stay on task. He apparently did have a syncopal episode on September 18. He describes it is becoming very weak and diaphoretic and then passing out but no chest pain, shortness of breath, DOE, rapid heart rate, numbness tingling or weakness. He was weak and dizzy afterwards. He did not go to the ER to get evaluated. He also has had some difficulty with right leg and knee pain. He was again difficult to get him to talk about whether it was his leg or knee. He has seen Dr. Lorin Mercy for evaluation and also had injections at [facility] she states were not very useful. He has had previous AAA evaluation due to his smoking. The evaluation did show evidence of coronary artery calcification as well as atherosclerosis. He has no plans on quitting.   Immunizations and Health Maintenance Immunization History  Administered Date(s) Administered  . Influenza, High Dose Seasonal PF 01/25/2014  . Pneumococcal Conjugate-13 09/01/2014   Health Maintenance Due  Topic Date Due  . Hepatitis C Screening  [birth year]  . TETANUS/TDAP  10/20/1964  . COLONOSCOPY  10/21/1995  . ZOSTAVAX  10/20/2005  . PNA vac Low Risk Adult (2 of 2 - PPSV23) 09/01/2015  . INFLUENZA VACCINE  11/28/2015    Last colonoscopy:  4-5 years ago- [facility] Last PSA: never Dentist: Danae Orleans  Ophtho:[facility]- Dr. Wynetta Emery or [place], Dr Tommy Rainwater cataract surgery.  Exercise: walking 7 x week.   Other doctors caring for patient include:No one  Advanced Directives: Does patient have an advance directive?: Yes Type of Advance Directive: Healthcare Power of Attorney, Living will  Depression screen:  See questionnaire below.     Depression screen [facility] 2/9 01/24/2016 09/01/2014  Decreased Interest 0 0  Down,  Depressed, Hopeless 0 0  PHQ - 2 Score 0 0    Fall Screen: See Questionaire below.   Fall Risk  01/24/2016 09/01/2014  Falls in the past year? Yes No  Number falls in past yr: 1 -  Injury with Fall? No -    ADL screen:  See questionnaire below.  Functional Status Survey: Is the patient deaf or have difficulty hearing?: No Does the patient have difficulty seeing, even when wearing glasses/contacts?: No Does the patient have difficulty concentrating, remembering, or making decisions?: No Does the patient have difficulty walking or climbing stairs?: Yes (joint pain.  stairs are uncomfortable. ) Does the patient have difficulty dressing or bathing?: No Does the patient have difficulty doing errands alone such as visiting a doctor's office or shopping?: No   Review of Systems  Constitutional: -, -unexpected weight change, -anorexia, -fatigue Allergy: -sneezing, -itching, -congestion Dermatology: denies changing moles, rash, lumps ENT: -runny nose, -ear pain, -sore throat,  Cardiology:  -chest pain, -palpitations, -orthopnea, Respiratory: -cough, -shortness of breath, -dyspnea on exertion, -wheezing,  Gastroenterology: -abdominal pain, -nausea, -vomiting, -diarrhea, -constipation, -dysphagia Hematology: -bleeding or bruising problems Musculoskeletal: -arthralgias, -myalgias, -joint swelling, -back pain, - Ophthalmology: -vision changes,  Urology: -dysuria, -difficulty urinating,  -urinary frequency, -urgency, incontinence Neurology: -, -numbness, , -memory loss, -falls, -dizziness    PHYSICAL EXAM:   General Appearance: Alert, cooperative, no distress, appears stated age Head: Normocephalic, without obvious abnormality, atraumatic Eyes: PERRL, conjunctiva/corneas clear, EOM's intact, fundi benign Ears: Normal TM's and external  ear canals Nose: Nares normal, mucosa normal, no drainage or sinus   tenderness Throat: Lips, mucosa, and tongue normal; teeth and gums normal Neck:  Supple, no lymphadenopathy, thyroid:no enlargement/tenderness/nodules; no carotid bruit or JVD Lungs: Clear to auscultation bilaterally without wheezes, rales or ronchi; respirations unlabored Heart: Irregular rate with  S1 and S2 normal, no murmur, rub or gallop Abdomen: Soft, non-tender, nondistended, normoactive bowel sounds, no masses, no hepatosplenomegaly Extremities: No clubbing, cyanosis or edema Pulses: 2+ and symmetric all extremities Skin: Skin color, texture, turgor normal, no rashes or lesions Lymph nodes: Cervical, supraclavicular, and axillary nodes normal Neurologic: CNII-XII intact, normal strength, sensation and gait; reflexes 2+ and symmetric throughout   Psych: Normal mood, affect, hygiene and grooming EKG does show a sinus arrhythmia ASSESSMENT/PLAN: Need for prophylactic vaccination against Streptococcus pneumoniae (pneumococcus) - Plan: Pneumococcal polysaccharide vaccine 23-valent greater than or equal to 2yo subcutaneous/IM, DISCONTINUED: pneumococcal 23 valent vaccine (PNU-IMMUNE) injection 0.5 mL  Obesity - Plan: CBC with Differential/Platelet, Comprehensive metabolic panel, Lipid panel  Personal history of tobacco use, presenting hazards to health  Coronary artery calcification - Plan: CBC with Differential/Platelet, Comprehensive metabolic panel, Lipid panel, Ambulatory referral to Cardiology  Atherosclerosis of aorta (HCC) - Plan: CBC with Differential/Platelet, Comprehensive metabolic panel, Lipid panel, Ambulatory referral to Cardiology  Sinus arrhythmia seen on electrocardiogram - Plan: EKG 12-Lead, Ambulatory referral to Cardiology  Syncope and collapse - Plan: Ambulatory referral to Cardiology Difficult to truly assess the syncope in regard to cardiac risk but he certainly has other risk factors in regard to calcification and think cardiac evaluation would be appropriate. Prescription written for him to get a shingles shot. He will get a flu shot at the  drugstore at the same time. He apparently did have a TDaP   recommended at least 30 minutes of aerobic activity at least 5 days/week; proper sunscreen use reviewed; healthy diet   Medicare Attestation I have personally reviewed: The patient's medical and social history Their use of alcohol, tobacco or illicit drugs Their current medications and supplements The patient's functional ability including ADLs,fall risks, home safety risks, cognitive, and hearing and visual impairment Diet and physical activities Evidence for depression or mood disorders  The patient's weight, height, and BMI have been recorded in the chart.  I have made referrals, counseling, and provided education to the patient based on review of the above and I have provided the patient with a written personalized care plan for preventive services.     Wyatt Haste, MD   01/24/2016

## 2016-01-24 NOTE — Telephone Encounter (Signed)
Pt notified of appt time and location.

## 2016-01-25 ENCOUNTER — Encounter: Payer: Self-pay | Admitting: Interventional Cardiology

## 2016-01-26 ENCOUNTER — Ambulatory Visit (INDEPENDENT_AMBULATORY_CARE_PROVIDER_SITE_OTHER): Payer: Medicare Other | Admitting: Interventional Cardiology

## 2016-01-26 ENCOUNTER — Ambulatory Visit (INDEPENDENT_AMBULATORY_CARE_PROVIDER_SITE_OTHER): Payer: Medicare Other

## 2016-01-26 ENCOUNTER — Encounter: Payer: Self-pay | Admitting: Interventional Cardiology

## 2016-01-26 VITALS — BP 140/76 | HR 59 | Ht 69.0 in | Wt 230.0 lb

## 2016-01-26 DIAGNOSIS — I2510 Atherosclerotic heart disease of native coronary artery without angina pectoris: Secondary | ICD-10-CM

## 2016-01-26 DIAGNOSIS — R55 Syncope and collapse: Secondary | ICD-10-CM

## 2016-01-26 DIAGNOSIS — E785 Hyperlipidemia, unspecified: Secondary | ICD-10-CM

## 2016-01-26 DIAGNOSIS — Z720 Tobacco use: Secondary | ICD-10-CM

## 2016-01-26 DIAGNOSIS — M7989 Other specified soft tissue disorders: Secondary | ICD-10-CM | POA: Diagnosis not present

## 2016-01-26 MED ORDER — ROSUVASTATIN CALCIUM 10 MG PO TABS: 10.0000 mg | ORAL_TABLET | Freq: Every day | ORAL | 3 refills | Status: DC

## 2016-01-26 MED ORDER — ASPIRIN EC 81 MG PO TBEC: 81.0000 mg | DELAYED_RELEASE_TABLET | Freq: Every day | ORAL | Status: AC

## 2016-01-26 NOTE — Patient Instructions (Signed)
Medication Instructions:  Your physician has recommended you make the following change in your medication:  1) START Aspirin 81mg  daily 2) START Crestor 10mg  daily. An Rx has been sent to your pharmacy   Labwork: Your physician recommends that you return for a FASTING lipid profile and lft in 6 weeks   Testing/Procedures: Your physician has requested that you have a lower extremity venous  exercise duplex. During this test, exercise and ultrasound are used to evaluate venous  blood flow in the legs. Allow one hour for this exam. There are no restrictions or special instructions. ( Please scheduled asap)  Your physician has recommended that you wear a holter monitor. Holter monitors are medical devices that record the heart's electrical activity. Doctors most often use these monitors to diagnose arrhythmias. Arrhythmias are problems with the speed or rhythm of the heartbeat. The monitor is a small, portable device. You can wear one while you do your normal daily activities. This is usually used to diagnose what is causing palpitations/syncope (passing out). ( Please schedule asap)  Your physician has requested that you have a lexiscan myoview. For further information please visit [email or web address]. Please follow instruction sheet, as given.     Follow-Up: Your physician recommends that you schedule a follow-up appointment in: 2-4 weeks with Dr.Smith OK to double book.   Any Other Special Instructions Will Be Listed Below (If Applicable).     If you need a refill on your cardiac medications before your next appointment, please call your pharmacy.

## 2016-01-26 NOTE — Progress Notes (Signed)
Cardiology Office Note    Date:  01/26/2016   ID:  Kyle Beck, DOB Nov 20, 1945, MRN [id number]  PCP:  Wyatt Haste, MD  Cardiologist: Sinclair Grooms, MD   Chief Complaint  Patient presents with  . Loss of Consciousness    History of Present Illness:  Kyle Beck is a 70 y.o. male who is referred for evaluation of syncope. He has had intermittent right greater than left lower extremity swelling, weakness since an episode of syncope 2 weeks ago, and he is a cigarette smoker.  He fainted on 01/15/16 after a long motor cycle ride to [place]. Noticed pain and swelling in the right leg. Had adult beverages and after eating began feeling weak. He continued to stand feeling weak and eventually fainted.Post syncope had clammy cold feeling. No chest pain or palpitations.Had significant swelling in right leg. According to observers, he was not breathing correctly. Took weeks to start feeling better.     Past Medical History:  Diagnosis Date  . Arthritis    hips and spine  . Benign colon polyp   . Shortness of breath    exertional  . Sleep apnea   . Tonsillar hypertrophy, unilateral     Past Surgical History:  Procedure Laterality Date  . APPENDECTOMY  2010  . COLONOSCOPY  2013   Dr. Earlean Shawl  . EYE SURGERY    . FEMUR SOFT TISSUE TUMOR EXCISION     states growth removed-done 06/04/2011-has incision still has stitches in  . POLYPECTOMY  06/10/2011   Procedure: POLYPECTOMY;  Surgeon: Thornell Sartorius, MD;  Location: [place];  Service: ENT;  Laterality: Right;  right tonsilar polypectomy  . REFRACTIVE SURGERY  1994  . UVULOPALATOPHARYNGOPLASTY  06/10/2011   Procedure: UVULOPALATOPHARYNGOPLASTY (UPPP);  Surgeon: Thornell Sartorius, MD;  Location: [place];  Service: ENT;  Laterality: N/A;    Current Medications: No outpatient prescriptions prior to visit.   No facility-administered medications prior to visit.      Allergies:   Review of patient's allergies indicates no  known allergies.   Social History   Social History  . Marital status: Married    Spouse name: N/A  . Number of children: N/A  . Years of education: N/A   Social History Main Topics  . Smoking status: Current Every Day Smoker    Packs/day: 1.50    Years: 52.00    Types: Cigarettes  . Smokeless tobacco: Never Used  . Alcohol use 0.0 oz/week    1 - 6 Cans of beer per week     Comment: weekly  . Drug use: No  . Sexual activity: Not Currently   Other Topics Concern  . None   Social History Narrative  . None     Family History:  The patient's family history includes Leukemia in his father; Non-Hodgkin's lymphoma in his mother.   ROS:   Please see the history of present illness.    Dizziness, passing out shortness of breath and leg swelling and leg pain fatigue snoring joint swelling and difficulty with balance.  All other systems reviewed and are negative.   PHYSICAL EXAM:   VS:  BP 140/76 (BP Location: Left Arm)   Pulse (!) 59   Ht 5\' 9"  (1.753 m)   Wt 230 lb (104.3 kg)   BMI 33.97 kg/m    GEN: Well nourished, well developed, in no acute distress  HEENT: normal  Neck: no JVD, carotid bruits, or masses Cardiac: RRR; no  murmurs, rubs, or gallops,no edema  Respiratory:  clear to auscultation bilaterally, normal work of breathing GI: soft, nontender, nondistended, + BS MS: no deformity or atrophy  Skin: warm and dry, no rash Neuro:  Alert and Oriented x 3, Strength and sensation are intact Psych: euthymic mood, full affect  Wt Readings from Last 3 Encounters:  01/26/16 230 lb (104.3 kg)  01/24/16 230 lb 6.4 oz (104.5 kg)  09/01/14 241 lb 9.6 oz (109.6 kg)      Studies/Labs Reviewed:   EKG:  EKG  Performed on 01/24/16 demonstrating sinus bradycardia with PACs  Recent Labs: 01/24/2016: ALT 26; BUN 14; Creat 1.05; Hemoglobin 16.2; Platelets 220; Potassium 4.4; Sodium 138   Lipid Panel    Component Value Date/Time   CHOL 205 (H) 01/24/2016 1357   TRIG 90  01/24/2016 1357   HDL 36 (L) 01/24/2016 1357   CHOLHDL 5.7 (H) 01/24/2016 1357   VLDL 18 01/24/2016 1357   LDLCALC 151 (H) 01/24/2016 1357    Additional studies/ records that were reviewed today include:  CT scan 12/2015 IMPRESSION: 1. Lung-Rads category 3, probably benign findings. Short-term follow-up in 6 months is recommended with repeat low-dose chest CT without contrast (please use the following order, "CT CHEST LCS NODULE FOLLOW-UP W/O CM"). 2. Aortic atherosclerosis and coronary artery calcification.   ASSESSMENT:    1. Vasovagal syncope   2. Left leg swelling   3. Coronary artery calcification seen on CAT scan   4. Tobacco abuse   5. Hyperlipidemia      PLAN:  In order of problems listed above:  1. Worrisome episode with features suggestive of vasovagal I ever given massive right lower extremity swelling, the possibility of multiple pulmonary emboli with syncope needs to be excluded. He denies any shortness of breath with episode but states that he "didn't feel right" and still feels somewhat woozy and with decreased energy. A bilateral lower extremity duplex study on the venous system will be performed to exclude DVT. We'll also do a 48 hour Holter monitor. 2. Rule out DVT. Aspirin 81 mg daily. 3. Lexiscan myocardial perfusion imaging 4. Encouraged smoking cessation 5. Crestor 10 mg per day. Liver and lipid panel in 6 weeks.    Medication Adjustments/Labs and Tests Ordered: Current medicines are reviewed at length with the patient today.  Concerns regarding medicines are outlined above.  Medication changes, Labs and Tests ordered today are listed in the Patient Instructions below. There are no Patient Instructions on file for this visit.   Signed, Sinclair Grooms, MD  01/26/2016 9:33 AM    [facility] [place], [place],   [address] Phone: [phone]; Fax: [phone]

## 2016-01-29 ENCOUNTER — Telehealth (HOSPITAL_COMMUNITY): Payer: Self-pay | Admitting: *Deleted

## 2016-01-29 NOTE — Telephone Encounter (Signed)
Patient given detailed instructions per Myocardial Perfusion Study Information Sheet for the test on  01/31/16 . Patient notified to arrive 15 minutes early and that it is imperative to arrive on time for appointment to keep from having the test rescheduled.  If you need to cancel or reschedule your appointment, please call the office within 24 hours of your appointment. Failure to do so may result in a cancellation of your appointment, and a $50 no show fee. Patient verbalized understanding.  Lamya Lausch Jacqueline    

## 2016-01-30 ENCOUNTER — Ambulatory Visit (HOSPITAL_COMMUNITY)
Admission: RE | Admit: 2016-01-30 | Discharge: 2016-01-30 | Disposition: A | Discharge status: Home / Self Care | Payer: Medicare Other | Source: Ambulatory Visit | Attending: Cardiovascular Disease | Admitting: Cardiovascular Disease

## 2016-01-30 DIAGNOSIS — M7989 Other specified soft tissue disorders: Secondary | ICD-10-CM | POA: Diagnosis not present

## 2016-02-01 ENCOUNTER — Ambulatory Visit (HOSPITAL_COMMUNITY): Payer: Medicare Other | Attending: Cardiology

## 2016-02-01 DIAGNOSIS — I2510 Atherosclerotic heart disease of native coronary artery without angina pectoris: Secondary | ICD-10-CM | POA: Diagnosis not present

## 2016-02-01 DIAGNOSIS — R9439 Abnormal result of other cardiovascular function study: Secondary | ICD-10-CM | POA: Insufficient documentation

## 2016-02-01 DIAGNOSIS — R55 Syncope and collapse: Secondary | ICD-10-CM | POA: Diagnosis not present

## 2016-02-01 LAB — MYOCARDIAL PERFUSION IMAGING
CHL CUP NUCLEAR SSS: 5
LHR: 0.34
LV sys vol: 68 mL
LVDIAVOL: 125 mL (ref 62–150)
Peak HR: 83 {beats}/min
Rest HR: 56 {beats}/min
SDS: 0
SRS: 5
TID: 1.08

## 2016-02-01 MED ORDER — TECHNETIUM TC 99M TETROFOSMIN IV KIT
10.5000 | PACK | Freq: Once | INTRAVENOUS | Status: AC | PRN
  Administered 2016-02-01: 11 via INTRAVENOUS
  Filled 2016-02-01: qty 11

## 2016-02-01 MED ORDER — TECHNETIUM TC 99M TETROFOSMIN IV KIT
33.0000 | PACK | Freq: Once | INTRAVENOUS | Status: AC | PRN
  Administered 2016-02-01: 33 via INTRAVENOUS
  Filled 2016-02-01: qty 33

## 2016-02-01 MED ORDER — REGADENOSON 0.4 MG/5ML IV SOLN
0.4000 mg | Freq: Once | INTRAVENOUS | Status: AC
  Administered 2016-02-01: 0.4 mg via INTRAVENOUS

## 2016-02-14 NOTE — Progress Notes (Signed)
Stress Nuclear 01/2016 : intermediate risk study. Overall left ventricular systolic function was abnormal. LV cavity size is normal. The left ventricular ejection fraction is mildly decreased (45-54%). There is no prior study for comparison.

## 2016-02-15 ENCOUNTER — Encounter: Payer: Self-pay | Admitting: Interventional Cardiology

## 2016-02-15 ENCOUNTER — Ambulatory Visit (INDEPENDENT_AMBULATORY_CARE_PROVIDER_SITE_OTHER): Payer: Medicare Other | Admitting: Interventional Cardiology

## 2016-02-15 VITALS — BP 122/78 | HR 76 | Ht 69.0 in | Wt 234.0 lb

## 2016-02-15 DIAGNOSIS — Z720 Tobacco use: Secondary | ICD-10-CM

## 2016-02-15 DIAGNOSIS — R55 Syncope and collapse: Secondary | ICD-10-CM | POA: Diagnosis not present

## 2016-02-15 DIAGNOSIS — I2510 Atherosclerotic heart disease of native coronary artery without angina pectoris: Secondary | ICD-10-CM

## 2016-02-15 DIAGNOSIS — I50810 Right heart failure, unspecified: Secondary | ICD-10-CM

## 2016-02-15 DIAGNOSIS — M7989 Other specified soft tissue disorders: Secondary | ICD-10-CM

## 2016-02-15 DIAGNOSIS — E784 Other hyperlipidemia: Secondary | ICD-10-CM

## 2016-02-15 DIAGNOSIS — I951 Orthostatic hypotension: Secondary | ICD-10-CM

## 2016-02-15 DIAGNOSIS — E7849 Other hyperlipidemia: Secondary | ICD-10-CM

## 2016-02-15 NOTE — Progress Notes (Signed)
Cardiology Office Note    Date:  02/15/2016   ID:  Kyle Beck, DOB 02-13-1946, MRN [id number]  PCP:  Wyatt Haste, MD  Cardiologist: Sinclair Grooms, MD   Chief Complaint  Patient presents with  . Loss of Consciousness    History of Present Illness:  Kyle Beck is a 70 y.o. male with history of asymptomatic coronary artery disease (coronary artery calcification by CT and intermediate risk nuclear study with no evidence of ischemia but EF of 45%), unremarkable 48 hour Holter monitor, and negative bilateral lower extremity DVT study.  Please refer to the initial office note. Patient had syncope after a long day of motorcycle riding, noting bilateral lower extremity swelling, and significant alcohol intake. He has not had a recurrence of syncope. He continues to have orthostatic episodes of lightheadedness and dizziness. He does not drink alcohol on a regular basis.    Past Medical History:  Diagnosis Date  . Arthritis    hips and spine  . Benign colon polyp   . Shortness of breath    exertional  . Sleep apnea   . Tonsillar hypertrophy, unilateral     Past Surgical History:  Procedure Laterality Date  . APPENDECTOMY  2010  . COLONOSCOPY  2013   Dr. Earlean Shawl  . EYE SURGERY    . FEMUR SOFT TISSUE TUMOR EXCISION     states growth removed-done 06/04/2011-has incision still has stitches in  . POLYPECTOMY  06/10/2011   Procedure: POLYPECTOMY;  Surgeon: Thornell Sartorius, MD;  Location: [facility];  Service: ENT;  Laterality: Right;  right tonsilar polypectomy  . REFRACTIVE SURGERY  1994  . UVULOPALATOPHARYNGOPLASTY  06/10/2011   Procedure: UVULOPALATOPHARYNGOPLASTY (UPPP);  Surgeon: Thornell Sartorius, MD;  [facility];  Service: ENT;  Laterality: N/A;    Current Medications: Outpatient Medications Prior to Visit  Medication Sig Dispense Refill  . aspirin EC 81 MG tablet Take 1 tablet (81 mg total) by mouth daily.    . rosuvastatin (CRESTOR) 10 MG tablet Take  1 tablet (10 mg total) by mouth daily. 90 tablet 3   No facility-administered medications prior to visit.      Allergies:   Review of patient's allergies indicates no known allergies.   Social History   Social History  . Marital status: Married    Spouse name: N/A  . Number of children: N/A  . Years of education: N/A   Social History Main Topics  . Smoking status: Current Every Day Smoker    Packs/day: 1.50    Years: 52.00    Types: Cigarettes  . Smokeless tobacco: Never Used  . Alcohol use 0.0 oz/week    1 - 6 Cans of beer per week     Comment: weekly  . Drug use: No  . Sexual activity: Not Currently   Other Topics Concern  . None   Social History Narrative  . None     Family History:  The patient's family history includes Leukemia in his father; Non-Hodgkin's lymphoma in his mother.   ROS:   Please see the history of present illness.    A lung nodule was noted on CT scan which needs to be repeated.  All other systems reviewed and are negative.   PHYSICAL EXAM:   VS:  BP 122/78   Pulse 76   Ht 5\' 9"  (1.753 m)   Wt 234 lb (106.1 kg)   SpO2 97%   BMI 34.56 kg/m  GEN: Well nourished, well developed, in no acute distress. Obese.  HEENT: normal  Neck: no JVD, carotid bruits, or masses Cardiac: Irregular RR; no murmurs, rubs, or gallops,no edema  Respiratory:  clear to auscultation bilaterally, normal work of breathing GI: soft, nontender, nondistended, + BS MS: no deformity or atrophy  Skin: warm and dry, no rash Neuro:  Alert and Oriented x 3, Strength and sensation are intact Psych: euthymic mood, full affect  Wt Readings from Last 3 Encounters:  02/15/16 234 lb (106.1 kg)  01/26/16 230 lb (104.3 kg)  01/24/16 230 lb 6.4 oz (104.5 kg)    Orthostatic blood pressure recordings: Lying 160/80 mmHg with heart rate of 76 bpm; sitting 150/80 mmHg with heart rate of 60; standing 135/70 mmHg with heart rate of 60.  Studies/Labs Reviewed:   EKG:  EKG  Not  repeated  Recent Labs: 01/24/2016: ALT 26; BUN 14; Creat 1.05; Hemoglobin 16.2; Platelets 220; Potassium 4.4; Sodium 138   Lipid Panel    Component Value Date/Time   CHOL 205 (H) 01/24/2016 1357   TRIG 90 01/24/2016 1357   HDL 36 (L) 01/24/2016 1357   CHOLHDL 5.7 (H) 01/24/2016 1357   VLDL 18 01/24/2016 1357   LDLCALC 151 (H) 01/24/2016 1357    Additional studies/ records that were reviewed today include:   Lexiscan Myoview study intermediate risk. Personally reviewed. EF 45%.  48 hour Holter with heart rate ranged between 45 and 113 bpm without pauses or sustained arrhythmia.  Lower extremity DVT study was unremarkable.  CT scan did demonstrate evidence of emphysema and a pulmonary nodule.    ASSESSMENT:    1. Vasovagal syncope   2. Left leg swelling   3. Coronary artery calcification seen on CAT scan   4. Tobacco abuse   5. Other hyperlipidemia   6. Right heart failure      PLAN:  In order of problems listed above:  1. This is a clinical diagnosis made of the initial office visit. There may be connection with problem #2 discovered today which is moderate systolic and less severe diastolic orthostatic hypotension. The patient has been instructed on reaction to prodrome. 2. Identified today. Prescribed moderate tension knee-high support stockings. 3. Bilateral lower extremity Doppler study was performed and revealed no evidence of DVT. 4. No evidence of ischemia was noted on nuclear testing. Aggressive risk factor modification including managing lipids to LDL of 70 has been prescribed. 5. With CT documented emphysema, coronary disease, and lung nodule noted on CT I have strongly advised the patient to discontinue smoking. 6. We discussed the rationale for aggressive statin therapy. LDL target is 70. 7. 2-D echocardiogram will be performed to assess for pulmonary hypertension in this patient with documented emphysema.    Medication Adjustments/Labs and Tests  Ordered: Current medicines are reviewed at length with the patient today.  Concerns regarding medicines are outlined above.  Medication changes, Labs and Tests ordered today are listed in the Patient Instructions below. Patient Instructions  Medication Instructions:  Your physician recommends that you continue on your current medications as directed. Please refer to the Current Medication list given to you today.   Labwork: None ordered  Testing/Procedures: Your physician has requested that you have an echocardiogram. Echocardiography is a painless test that uses sound waves to create images of your heart. It provides your doctor with information about the size and shape of your heart and how well your heart's chambers and valves are working. This procedure takes approximately one hour.  There are no restrictions for this procedure.   Follow-Up: Your physician wants you to follow-up in: 4-6 months with Dr.Smith You will receive a reminder letter in the mail two months in advance. If you don't receive a letter, please call our office to schedule the follow-up appointment.   Any Other Special Instructions Will Be Listed Below (If Applicable). Please purchase and wear daily, moderate tension knee high support stockings.     If you need a refill on your cardiac medications before your next appointment, please call your pharmacy.      Signed, Sinclair Grooms, MD  02/15/2016 1:39 PM    [facility] [place], [place], [place]  [address] Phone: [phone]; Fax: [phone]

## 2016-02-15 NOTE — Patient Instructions (Signed)
Medication Instructions:  Your physician recommends that you continue on your current medications as directed. Please refer to the Current Medication list given to you today.   Labwork: None ordered  Testing/Procedures: Your physician has requested that you have an echocardiogram. Echocardiography is a painless test that uses sound waves to create images of your heart. It provides your doctor with information about the size and shape of your heart and how well your heart's chambers and valves are working. This procedure takes approximately one hour. There are no restrictions for this procedure.   Follow-Up: Your physician wants you to follow-up in: 4-6 months with Dr.Smith You will receive a reminder letter in the mail two months in advance. If you don't receive a letter, please call our office to schedule the follow-up appointment.   Any Other Special Instructions Will Be Listed Below (If Applicable). Please purchase and wear daily, moderate tension knee high support stockings.     If you need a refill on your cardiac medications before your next appointment, please call your pharmacy.

## 2016-03-05 ENCOUNTER — Other Ambulatory Visit: Payer: Self-pay

## 2016-03-05 DIAGNOSIS — R911 Solitary pulmonary nodule: Secondary | ICD-10-CM

## 2016-03-08 ENCOUNTER — Ambulatory Visit (HOSPITAL_COMMUNITY): Payer: Medicare Other | Attending: Cardiovascular Disease

## 2016-03-08 ENCOUNTER — Other Ambulatory Visit: Payer: Medicare Other | Admitting: *Deleted

## 2016-03-08 ENCOUNTER — Other Ambulatory Visit: Payer: Self-pay

## 2016-03-08 ENCOUNTER — Other Ambulatory Visit: Payer: Self-pay | Admitting: Family Medicine

## 2016-03-08 DIAGNOSIS — I50810 Right heart failure, unspecified: Secondary | ICD-10-CM | POA: Insufficient documentation

## 2016-03-08 DIAGNOSIS — I2510 Atherosclerotic heart disease of native coronary artery without angina pectoris: Secondary | ICD-10-CM

## 2016-03-08 DIAGNOSIS — R55 Syncope and collapse: Secondary | ICD-10-CM | POA: Diagnosis not present

## 2016-03-08 DIAGNOSIS — I071 Rheumatic tricuspid insufficiency: Secondary | ICD-10-CM | POA: Insufficient documentation

## 2016-03-08 DIAGNOSIS — R0602 Shortness of breath: Secondary | ICD-10-CM | POA: Insufficient documentation

## 2016-03-08 DIAGNOSIS — G4730 Sleep apnea, unspecified: Secondary | ICD-10-CM | POA: Diagnosis not present

## 2016-03-08 DIAGNOSIS — R918 Other nonspecific abnormal finding of lung field: Secondary | ICD-10-CM

## 2016-03-08 LAB — HEPATIC FUNCTION PANEL
ALBUMIN: 4.6 g/dL (ref 3.6–5.1)
ALT: 24 U/L (ref 9–46)
AST: 20 U/L (ref 10–35)
Alkaline Phosphatase: 53 U/L (ref 40–115)
BILIRUBIN DIRECT: 0.1 mg/dL (ref <=0.2)
Indirect Bilirubin: 0.4 mg/dL (ref 0.2–1.2)
TOTAL PROTEIN: 7.5 g/dL (ref 6.1–8.1)
Total Bilirubin: 0.5 mg/dL (ref 0.2–1.2)

## 2016-03-08 LAB — LIPID PANEL
CHOL/HDL RATIO: 4.1 ratio (ref <5.0)
CHOLESTEROL: 123 mg/dL (ref <200)
HDL: 30 mg/dL — AB (ref >40)
LDL Cholesterol: 71 mg/dL (ref <100)
TRIGLYCERIDES: 108 mg/dL (ref <150)
VLDL: 22 mg/dL (ref <30)

## 2016-03-11 ENCOUNTER — Ambulatory Visit
Admission: RE | Admit: 2016-03-11 | Discharge: 2016-03-11 | Disposition: A | Discharge status: Home / Self Care | Payer: Medicare Other | Source: Ambulatory Visit | Attending: Family Medicine | Admitting: Family Medicine

## 2016-03-11 DIAGNOSIS — Z87891 Personal history of nicotine dependence: Secondary | ICD-10-CM | POA: Diagnosis not present

## 2016-03-11 DIAGNOSIS — R911 Solitary pulmonary nodule: Secondary | ICD-10-CM

## 2016-03-11 DIAGNOSIS — F17210 Nicotine dependence, cigarettes, uncomplicated: Secondary | ICD-10-CM | POA: Diagnosis not present

## 2016-03-11 DIAGNOSIS — R918 Other nonspecific abnormal finding of lung field: Secondary | ICD-10-CM | POA: Diagnosis not present

## 2016-04-16 ENCOUNTER — Telehealth: Payer: Self-pay

## 2016-04-16 NOTE — Telephone Encounter (Signed)
Patient has transferred care to [facility] d/t moving to [place], [place]. Kyle Beck

## 2016-05-01 DIAGNOSIS — E782 Mixed hyperlipidemia: Secondary | ICD-10-CM | POA: Diagnosis not present

## 2016-08-22 IMAGING — CT CT CHEST LUNG CANCER SCREENING LOW DOSE W/O CM
1 of 5 series · 15 of 40 positions shown, 19 images · non-contrast
Comparison: 09/09/2014

CLINICAL DATA: Lung cancer screening. Eighty-four pack-year
history. Current asymptomatic smoker.

EXAM:
CT CHEST WITHOUT CONTRAST LOW-DOSE FOR LUNG CANCER SCREENING
TECHNIQUE: Multidetector CT imaging of the chest was performed following the
standard protocol without IV contrast.

[Series 3: lung windows · axial · 0.76mm/px · z∈[-290,-5]mm · 15 of 253 slices shown, 19 images]
[im 13/253  mediastinal]
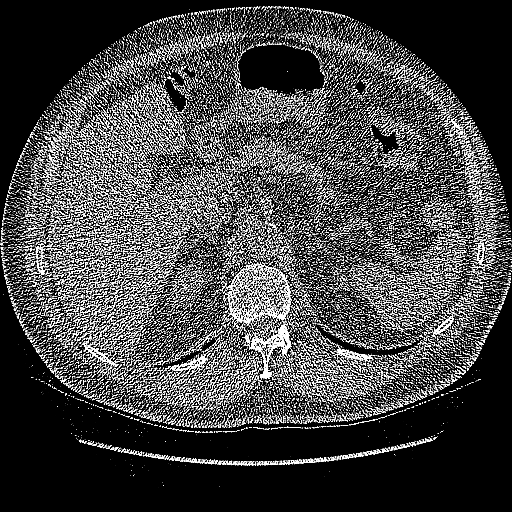
[im 13/253  lung]
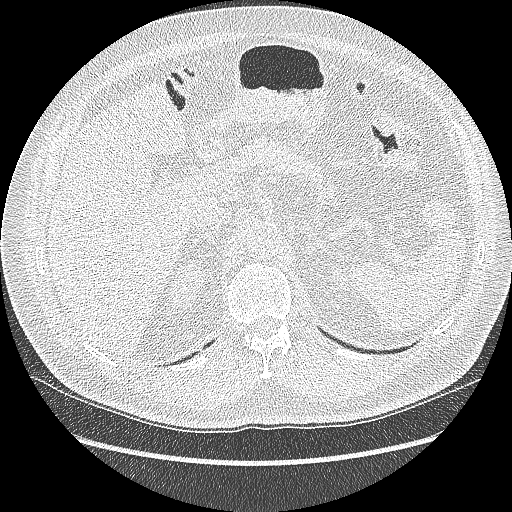
[im 37/253  lung]
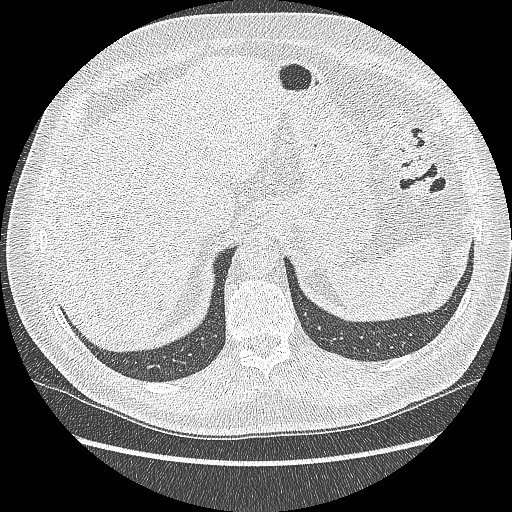
[im 49/253  lung]
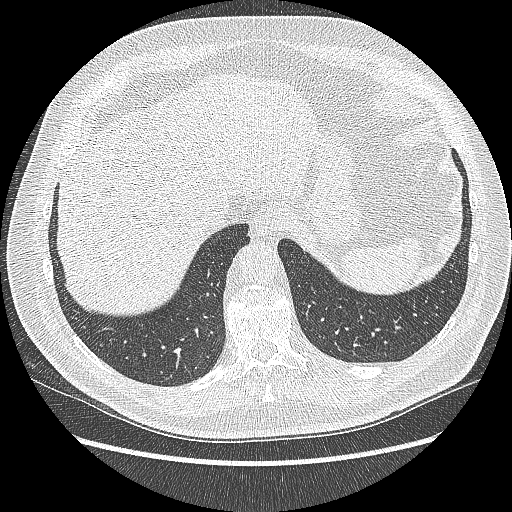
[im 61/253  lung]
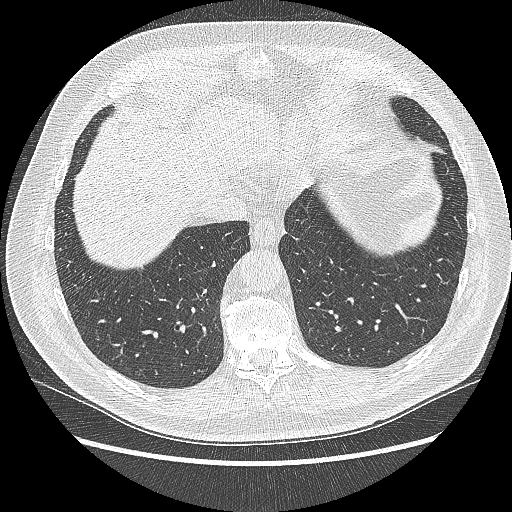
[im 85/253  mediastinal]
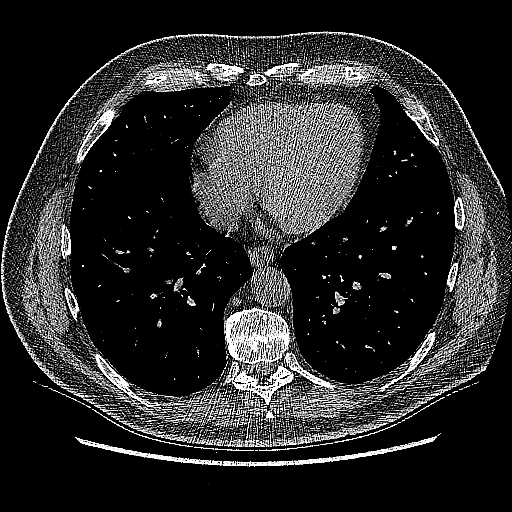
[im 85/253  lung]
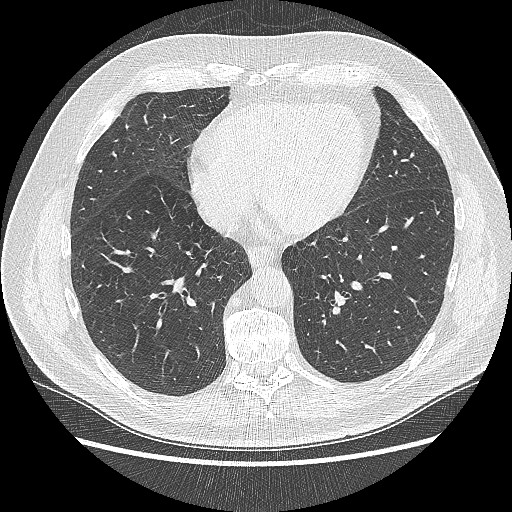
[im 97/253  lung]
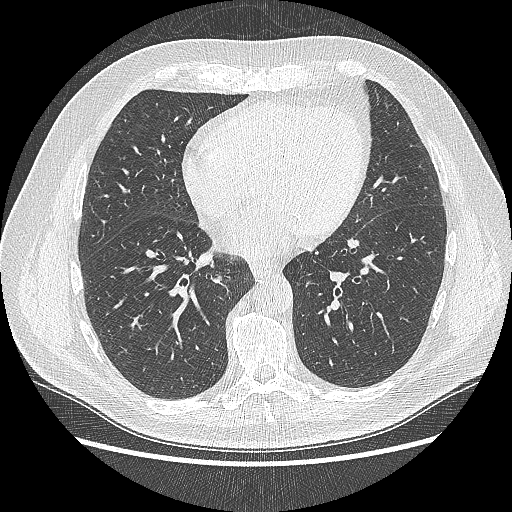
[im 109/253  lung]
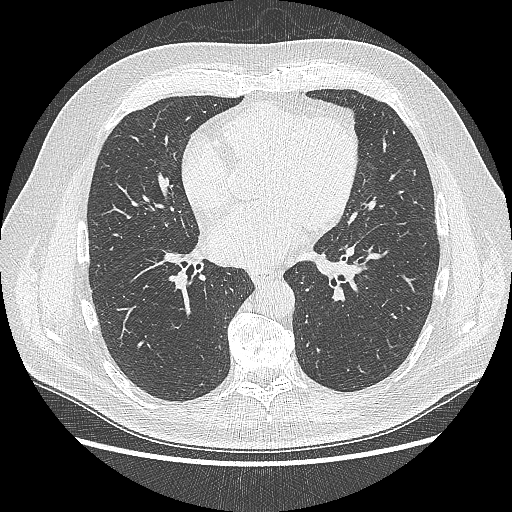
[im 133/253  lung]
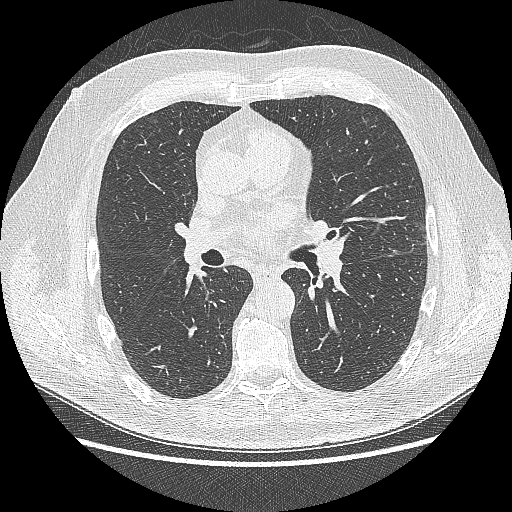
[im 145/253  mediastinal]
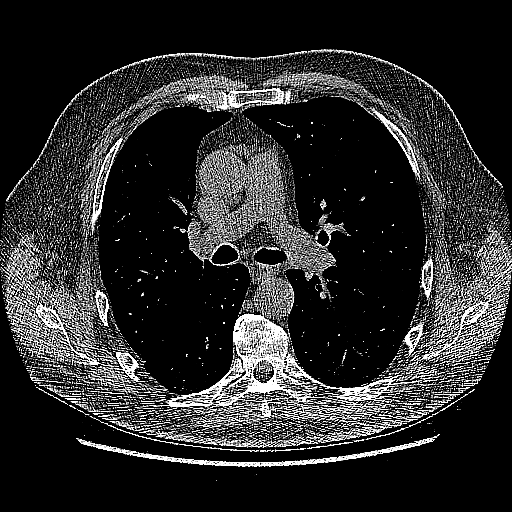
[im 145/253  lung]
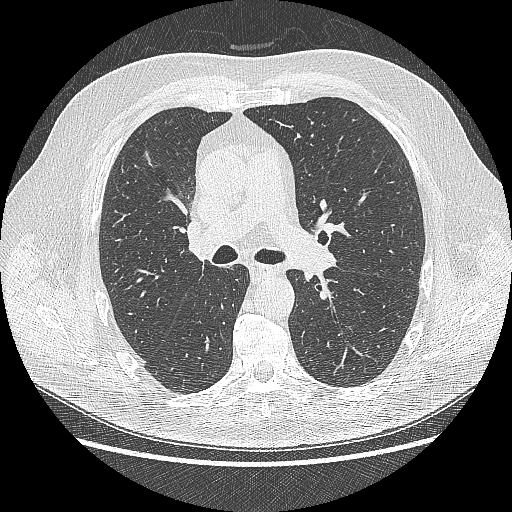
[im 157/253  lung]
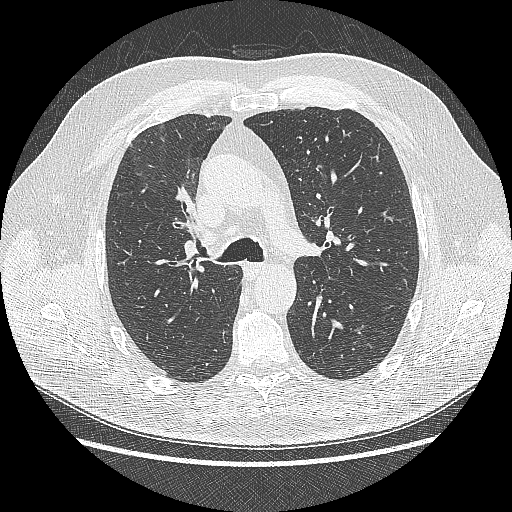
[im 181/253  lung]
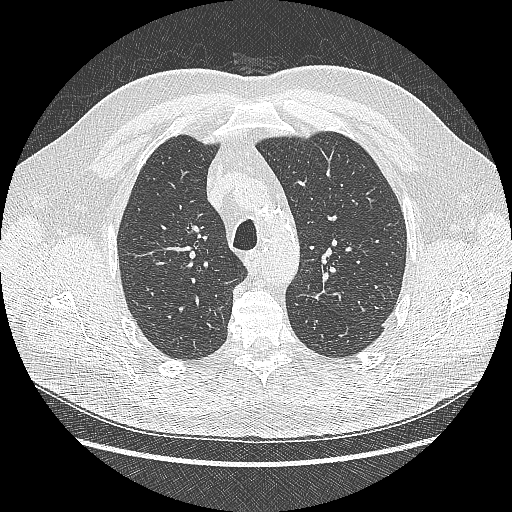
[im 193/253  lung]
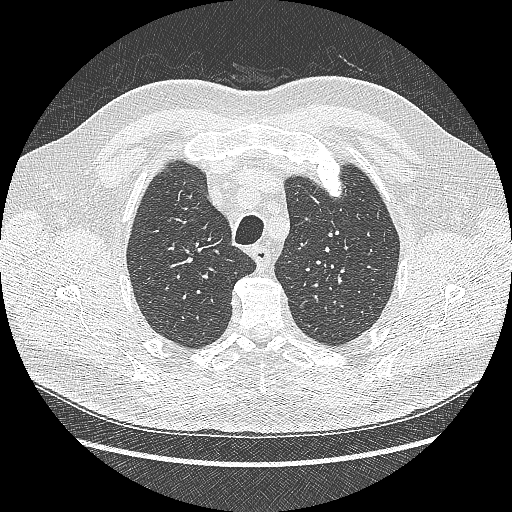
[im 205/253  mediastinal]
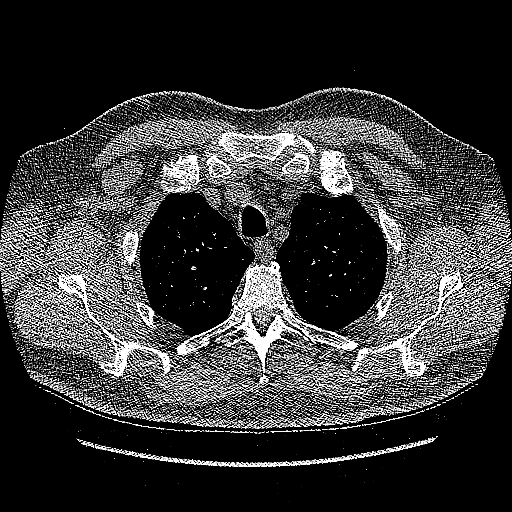
[im 205/253  lung]
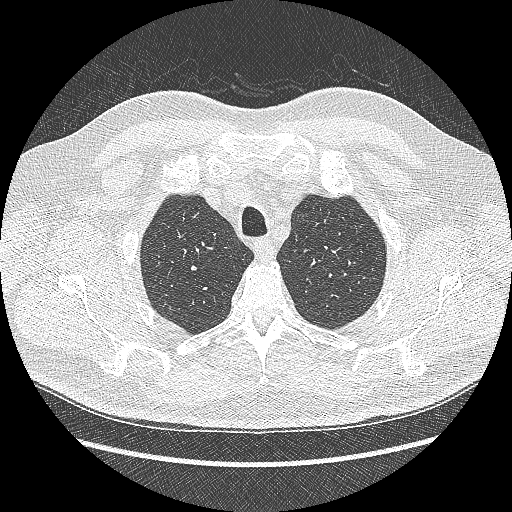
[im 229/253  lung]
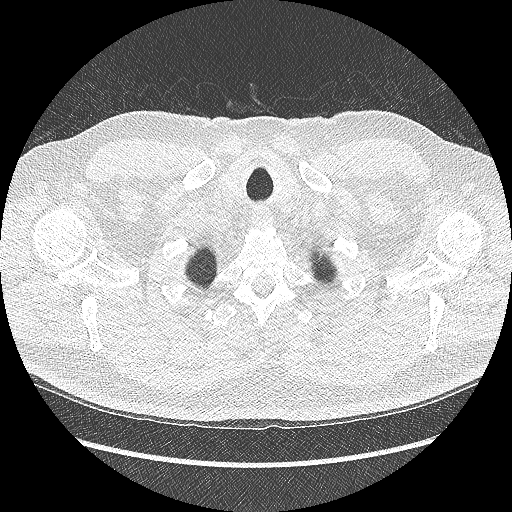
[im 241/253  lung]
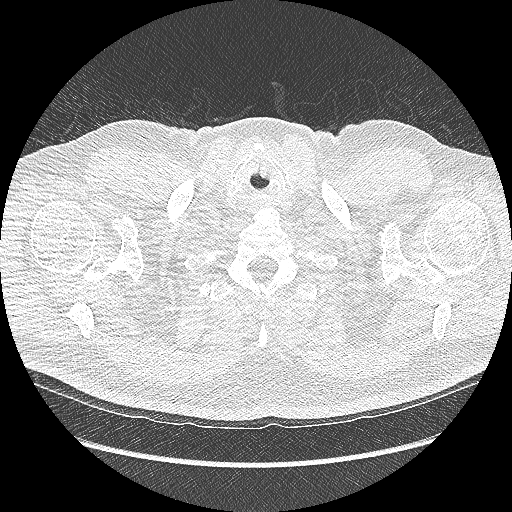

[15 of 40 positions shown; findings below may reference images not displayed]

FINDINGS: Mediastinum/Nodes: The heart size is normal. There is no pericardial
effusion identified. Aortic atherosclerosis noted. Calcification is
identified within the LAD and left circumflex coronary artery. No
mediastinal or hilar adenopathy identified.

Lungs/Pleura: Mild to moderate changes of centrilobular emphysema
identified. There is a new nodule identified within the left upper
lobe adjacent to the oblique fissure. This has an equivalent
diameter of 5.8 mm, image number 125 of series.

Upper abdomen: No acute findings identified The adrenal glands are
normal. Normal appearance of the spleen. The visualized portions of
the pancreas are normal.

Musculoskeletal: Spondylosis is noted within the thoracic spine. No
aggressive lytic or sclerotic bone lesions.
IMPRESSION: 1. Lung-Rads category 3, probably benign findings. Short-term
follow-up in 6 months is recommended with repeat low-dose chest CT
without contrast (please use the following order, "CT CHEST LCS
NODULE FOLLOW-UP W/O CM").
2. Aortic atherosclerosis and coronary artery calcification.

## 2016-09-30 DIAGNOSIS — R911 Solitary pulmonary nodule: Secondary | ICD-10-CM | POA: Diagnosis not present

## 2016-10-02 DIAGNOSIS — R911 Solitary pulmonary nodule: Secondary | ICD-10-CM | POA: Diagnosis not present

## 2016-10-02 DIAGNOSIS — R918 Other nonspecific abnormal finding of lung field: Secondary | ICD-10-CM | POA: Diagnosis not present

## 2017-01-06 DIAGNOSIS — J040 Acute laryngitis: Secondary | ICD-10-CM | POA: Diagnosis not present

## 2017-01-06 DIAGNOSIS — M25561 Pain in right knee: Secondary | ICD-10-CM | POA: Diagnosis not present

## 2017-01-06 DIAGNOSIS — M25461 Effusion, right knee: Secondary | ICD-10-CM | POA: Diagnosis not present

## 2017-01-24 DIAGNOSIS — J90 Pleural effusion, not elsewhere classified: Secondary | ICD-10-CM | POA: Diagnosis not present

## 2017-01-24 DIAGNOSIS — M25561 Pain in right knee: Secondary | ICD-10-CM | POA: Diagnosis not present

## 2017-01-24 DIAGNOSIS — S83231A Complex tear of medial meniscus, current injury, right knee, initial encounter: Secondary | ICD-10-CM | POA: Diagnosis not present

## 2017-01-24 DIAGNOSIS — M7121 Synovial cyst of popliteal space [Baker], right knee: Secondary | ICD-10-CM | POA: Diagnosis not present

## 2017-01-24 DIAGNOSIS — M25461 Effusion, right knee: Secondary | ICD-10-CM | POA: Diagnosis not present

## 2017-02-10 ENCOUNTER — Other Ambulatory Visit: Payer: Self-pay | Admitting: Interventional Cardiology

## 2017-02-12 DIAGNOSIS — M25569 Pain in unspecified knee: Secondary | ICD-10-CM | POA: Diagnosis not present

## 2017-02-12 DIAGNOSIS — S83241A Other tear of medial meniscus, current injury, right knee, initial encounter: Secondary | ICD-10-CM | POA: Diagnosis not present

## 2017-02-12 DIAGNOSIS — M1711 Unilateral primary osteoarthritis, right knee: Secondary | ICD-10-CM | POA: Diagnosis not present

## 2017-03-24 ENCOUNTER — Other Ambulatory Visit: Payer: Self-pay | Admitting: Interventional Cardiology

## 2017-04-07 DIAGNOSIS — R918 Other nonspecific abnormal finding of lung field: Secondary | ICD-10-CM | POA: Diagnosis not present

## 2017-04-07 DIAGNOSIS — R911 Solitary pulmonary nodule: Secondary | ICD-10-CM | POA: Diagnosis not present

## 2017-04-10 DIAGNOSIS — L814 Other melanin hyperpigmentation: Secondary | ICD-10-CM | POA: Diagnosis not present

## 2017-04-10 DIAGNOSIS — D1801 Hemangioma of skin and subcutaneous tissue: Secondary | ICD-10-CM | POA: Diagnosis not present

## 2017-04-10 DIAGNOSIS — L728 Other follicular cysts of the skin and subcutaneous tissue: Secondary | ICD-10-CM | POA: Diagnosis not present

## 2017-04-10 DIAGNOSIS — B353 Tinea pedis: Secondary | ICD-10-CM | POA: Diagnosis not present

## 2017-04-10 DIAGNOSIS — L905 Scar conditions and fibrosis of skin: Secondary | ICD-10-CM | POA: Diagnosis not present

## 2017-04-10 DIAGNOSIS — D2372 Other benign neoplasm of skin of left lower limb, including hip: Secondary | ICD-10-CM | POA: Diagnosis not present

## 2017-04-10 DIAGNOSIS — M720 Palmar fascial fibromatosis [Dupuytren]: Secondary | ICD-10-CM | POA: Diagnosis not present

## 2017-04-10 DIAGNOSIS — D235 Other benign neoplasm of skin of trunk: Secondary | ICD-10-CM | POA: Diagnosis not present

## 2017-04-10 DIAGNOSIS — L718 Other rosacea: Secondary | ICD-10-CM | POA: Diagnosis not present

## 2017-04-15 DIAGNOSIS — M25569 Pain in unspecified knee: Secondary | ICD-10-CM | POA: Diagnosis not present

## 2017-04-15 DIAGNOSIS — M25561 Pain in right knee: Secondary | ICD-10-CM | POA: Diagnosis not present

## 2017-05-02 DIAGNOSIS — Z79899 Other long term (current) drug therapy: Secondary | ICD-10-CM | POA: Diagnosis not present

## 2017-05-02 DIAGNOSIS — Z1211 Encounter for screening for malignant neoplasm of colon: Secondary | ICD-10-CM | POA: Diagnosis not present

## 2017-05-02 DIAGNOSIS — E782 Mixed hyperlipidemia: Secondary | ICD-10-CM | POA: Diagnosis not present

## 2017-05-07 DIAGNOSIS — Z79899 Other long term (current) drug therapy: Secondary | ICD-10-CM | POA: Diagnosis not present

## 2017-05-07 DIAGNOSIS — E782 Mixed hyperlipidemia: Secondary | ICD-10-CM | POA: Diagnosis not present

## 2017-05-08 DIAGNOSIS — R7301 Impaired fasting glucose: Secondary | ICD-10-CM | POA: Diagnosis not present

## 2017-05-27 DIAGNOSIS — R0781 Pleurodynia: Secondary | ICD-10-CM | POA: Diagnosis not present

## 2017-05-27 DIAGNOSIS — M25561 Pain in right knee: Secondary | ICD-10-CM | POA: Diagnosis not present

## 2017-05-27 DIAGNOSIS — S2231XB Fracture of one rib, right side, initial encounter for open fracture: Secondary | ICD-10-CM | POA: Diagnosis not present

## 2017-05-27 DIAGNOSIS — M1711 Unilateral primary osteoarthritis, right knee: Secondary | ICD-10-CM | POA: Diagnosis not present

## 2017-06-13 DIAGNOSIS — Z86010 Personal history of colonic polyps: Secondary | ICD-10-CM | POA: Diagnosis not present

## 2017-07-08 DIAGNOSIS — H43813 Vitreous degeneration, bilateral: Secondary | ICD-10-CM | POA: Diagnosis not present

## 2017-07-08 DIAGNOSIS — Z961 Presence of intraocular lens: Secondary | ICD-10-CM | POA: Diagnosis not present

## 2017-07-08 DIAGNOSIS — H1789 Other corneal scars and opacities: Secondary | ICD-10-CM | POA: Diagnosis not present

## 2017-07-08 DIAGNOSIS — H26493 Other secondary cataract, bilateral: Secondary | ICD-10-CM | POA: Diagnosis not present

## 2017-08-01 DIAGNOSIS — Z86010 Personal history of colonic polyps: Secondary | ICD-10-CM | POA: Diagnosis not present

## 2017-08-01 DIAGNOSIS — Z1211 Encounter for screening for malignant neoplasm of colon: Secondary | ICD-10-CM | POA: Diagnosis not present

## 2017-08-01 DIAGNOSIS — K635 Polyp of colon: Secondary | ICD-10-CM | POA: Diagnosis not present

## 2017-08-01 DIAGNOSIS — E785 Hyperlipidemia, unspecified: Secondary | ICD-10-CM | POA: Diagnosis not present

## 2017-08-01 DIAGNOSIS — K640 First degree hemorrhoids: Secondary | ICD-10-CM | POA: Diagnosis not present

## 2017-08-22 DIAGNOSIS — K635 Polyp of colon: Secondary | ICD-10-CM | POA: Diagnosis not present

## 2017-09-05 DIAGNOSIS — H02423 Myogenic ptosis of bilateral eyelids: Secondary | ICD-10-CM | POA: Diagnosis not present

## 2017-09-05 DIAGNOSIS — M1711 Unilateral primary osteoarthritis, right knee: Secondary | ICD-10-CM | POA: Diagnosis not present

## 2017-09-05 DIAGNOSIS — H02834 Dermatochalasis of left upper eyelid: Secondary | ICD-10-CM | POA: Diagnosis not present

## 2017-09-05 DIAGNOSIS — M25561 Pain in right knee: Secondary | ICD-10-CM | POA: Diagnosis not present

## 2017-09-05 DIAGNOSIS — H02831 Dermatochalasis of right upper eyelid: Secondary | ICD-10-CM | POA: Diagnosis not present

## 2017-10-15 ENCOUNTER — Telehealth: Payer: Self-pay

## 2017-10-15 NOTE — Telephone Encounter (Signed)
Called pt no answer . lvm to call back and make awv appt. With dr [employer]

## 2017-10-24 DIAGNOSIS — H02423 Myogenic ptosis of bilateral eyelids: Secondary | ICD-10-CM | POA: Diagnosis not present

## 2017-10-24 DIAGNOSIS — E785 Hyperlipidemia, unspecified: Secondary | ICD-10-CM | POA: Diagnosis not present

## 2017-10-24 DIAGNOSIS — H02834 Dermatochalasis of left upper eyelid: Secondary | ICD-10-CM | POA: Diagnosis not present

## 2017-10-24 DIAGNOSIS — H02831 Dermatochalasis of right upper eyelid: Secondary | ICD-10-CM | POA: Diagnosis not present

## 2017-10-24 DIAGNOSIS — H02403 Unspecified ptosis of bilateral eyelids: Secondary | ICD-10-CM | POA: Diagnosis not present

## 2017-11-13 DIAGNOSIS — Z79899 Other long term (current) drug therapy: Secondary | ICD-10-CM | POA: Diagnosis not present

## 2017-11-13 DIAGNOSIS — E782 Mixed hyperlipidemia: Secondary | ICD-10-CM | POA: Diagnosis not present

## 2017-11-13 DIAGNOSIS — R7301 Impaired fasting glucose: Secondary | ICD-10-CM | POA: Diagnosis not present
